# Patient Record
Sex: Female | Born: 1990 | Race: Black or African American | Hispanic: No | Marital: Single | Smoking: Never smoker
Health system: Southern US, Community
[De-identification: ages and names within clinical notes are randomized; demographics above are authoritative.]

## PROBLEM LIST (undated history)

## (undated) DIAGNOSIS — G8250 Quadriplegia, unspecified: Secondary | ICD-10-CM

## (undated) DIAGNOSIS — Z8619 Personal history of other infectious and parasitic diseases: Secondary | ICD-10-CM

## (undated) DIAGNOSIS — S129XXA Fracture of neck, unspecified, initial encounter: Secondary | ICD-10-CM

## (undated) HISTORY — DX: Personal history of other infectious and parasitic diseases: Z86.19

## (undated) HISTORY — DX: Fracture of neck, unspecified, initial encounter: S12.9XXA

## (undated) HISTORY — PX: MOUTH SURGERY: SHX715

## (undated) HISTORY — PX: OTHER SURGICAL HISTORY: SHX169

## (undated) HISTORY — DX: Quadriplegia, unspecified: G82.50

---
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
## 2012-07-03 LAB — TSH: TSH: 2.79 u[IU]/mL (ref 0.41–5.90)

## 2012-07-03 LAB — CBC AND DIFFERENTIAL
HCT: 39 % (ref 36–46)
HEMOGLOBIN: 12.9 g/dL (ref 12.0–16.0)
Platelets: 417 10*3/uL — AB (ref 150–399)
WBC: 7.6 10*3/mL

## 2014-02-11 ENCOUNTER — Emergency Department: Payer: Self-pay | Admitting: Emergency Medicine

## 2014-02-11 DIAGNOSIS — S129XXA Fracture of neck, unspecified, initial encounter: Secondary | ICD-10-CM | POA: Insufficient documentation

## 2014-02-11 LAB — COMPREHENSIVE METABOLIC PANEL
ALT: 25 U/L
AST: 30 U/L (ref 15–37)
Albumin: 3.2 g/dL — ABNORMAL LOW (ref 3.4–5.0)
Alkaline Phosphatase: 48 U/L
Anion Gap: 7 (ref 7–16)
BUN: 9 mg/dL (ref 7–18)
Bilirubin,Total: 0.3 mg/dL (ref 0.2–1.0)
CALCIUM: 8.5 mg/dL (ref 8.5–10.1)
CHLORIDE: 109 mmol/L — AB (ref 98–107)
CO2: 24 mmol/L (ref 21–32)
Creatinine: 0.83 mg/dL (ref 0.60–1.30)
Glucose: 83 mg/dL (ref 65–99)
Osmolality: 277 (ref 275–301)
POTASSIUM: 4 mmol/L (ref 3.5–5.1)
Sodium: 140 mmol/L (ref 136–145)
Total Protein: 6.8 g/dL (ref 6.4–8.2)

## 2014-02-11 LAB — DRUG SCREEN, URINE
Amphetamines, Ur Screen: NEGATIVE (ref ?–1000)
Barbiturates, Ur Screen: NEGATIVE (ref ?–200)
Benzodiazepine, Ur Scrn: NEGATIVE (ref ?–200)
COCAINE METABOLITE, UR ~~LOC~~: NEGATIVE (ref ?–300)
Cannabinoid 50 Ng, Ur ~~LOC~~: NEGATIVE (ref ?–50)
MDMA (ECSTASY) UR SCREEN: NEGATIVE (ref ?–500)
Methadone, Ur Screen: NEGATIVE (ref ?–300)
OPIATE, UR SCREEN: NEGATIVE (ref ?–300)
PHENCYCLIDINE (PCP) UR S: NEGATIVE (ref ?–25)
TRICYCLIC, UR SCREEN: NEGATIVE (ref ?–1000)

## 2014-02-11 LAB — CBC
HCT: 35.9 % (ref 35.0–47.0)
HGB: 11.8 g/dL — ABNORMAL LOW (ref 12.0–16.0)
MCH: 27.6 pg (ref 26.0–34.0)
MCHC: 32.7 g/dL (ref 32.0–36.0)
MCV: 84 fL (ref 80–100)
Platelet: 330 10*3/uL (ref 150–440)
RBC: 4.27 10*6/uL (ref 3.80–5.20)
RDW: 12.8 % (ref 11.5–14.5)
WBC: 9 10*3/uL (ref 3.6–11.0)

## 2014-02-11 LAB — HCG, QUANTITATIVE, PREGNANCY: Beta Hcg, Quant.: <1 m[IU]/mL — ABNORMAL LOW

## 2014-02-11 LAB — TSH: Thyroid Stimulating Horm: 1.55 u[IU]/mL

## 2014-02-11 LAB — ETHANOL: Ethanol: <3 mg/dL

## 2014-02-11 LAB — ACETAMINOPHEN LEVEL

## 2014-02-11 LAB — SALICYLATE LEVEL

## 2014-02-12 HISTORY — PX: OTHER SURGICAL HISTORY: SHX169

## 2014-02-15 DIAGNOSIS — S14105A Unspecified injury at C5 level of cervical spinal cord, initial encounter: Secondary | ICD-10-CM | POA: Insufficient documentation

## 2014-02-15 DIAGNOSIS — S14109A Unspecified injury at unspecified level of cervical spinal cord, initial encounter: Secondary | ICD-10-CM | POA: Insufficient documentation

## 2014-02-26 HISTORY — PX: TRACHEOSTOMY: SUR1362

## 2014-03-10 DIAGNOSIS — R0689 Other abnormalities of breathing: Secondary | ICD-10-CM | POA: Insufficient documentation

## 2014-03-22 HISTORY — PX: OTHER SURGICAL HISTORY: SHX169

## 2014-06-16 ENCOUNTER — Other Ambulatory Visit: Payer: Self-pay | Admitting: Family Medicine

## 2014-06-16 DIAGNOSIS — G8250 Quadriplegia, unspecified: Secondary | ICD-10-CM

## 2014-06-22 ENCOUNTER — Ambulatory Visit: Payer: Self-pay

## 2014-06-23 ENCOUNTER — Ambulatory Visit
Admission: RE | Admit: 2014-06-23 | Discharge: 2014-06-23 | Disposition: A | Discharge status: Home / Self Care | Payer: Medicaid Other | Source: Ambulatory Visit | Attending: Family Medicine | Admitting: Family Medicine

## 2014-06-23 DIAGNOSIS — G8250 Quadriplegia, unspecified: Secondary | ICD-10-CM

## 2014-06-23 DIAGNOSIS — S14109S Unspecified injury at unspecified level of cervical spinal cord, sequela: Secondary | ICD-10-CM

## 2014-07-22 ENCOUNTER — Telehealth: Payer: Self-pay | Admitting: *Deleted

## 2014-07-22 NOTE — Telephone Encounter (Signed)
Robin from home home called office requesting copy of written order be faxed back to her before tomorrow for pt's craterization=q 4 days  and bowl program x2 weekly. Searched all scripts for order but did not see one. Please advise? Fax# [phone]

## 2014-07-23 NOTE — Telephone Encounter (Signed)
Order faxed.

## 2014-07-23 NOTE — Telephone Encounter (Signed)
Order printed and signed.

## 2014-09-03 ENCOUNTER — Telehealth: Payer: Self-pay | Admitting: Family Medicine

## 2014-09-03 NOTE — Telephone Encounter (Signed)
Returned call, info given

## 2014-09-03 NOTE — Telephone Encounter (Signed)
Rosey Bath w/ [employer] would like to speak with a nurse to get pt's height & weight because she needs it to process the order for medical supplies. Thanks TNP

## 2014-10-14 DIAGNOSIS — G904 Autonomic dysreflexia: Secondary | ICD-10-CM | POA: Insufficient documentation

## 2015-03-12 ENCOUNTER — Telehealth: Payer: Self-pay | Admitting: Family Medicine

## 2015-03-12 NOTE — Telephone Encounter (Signed)
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 Shawna Orleans said she can get [name] physical therapy,etc for her through the dismemberment clause in the insurance.     Melanie's call back # is [phone]

## 2015-05-20 DIAGNOSIS — R591 Generalized enlarged lymph nodes: Secondary | ICD-10-CM | POA: Insufficient documentation

## 2015-05-20 DIAGNOSIS — S129XXA Fracture of neck, unspecified, initial encounter: Secondary | ICD-10-CM | POA: Insufficient documentation

## 2015-05-20 DIAGNOSIS — G8250 Quadriplegia, unspecified: Secondary | ICD-10-CM | POA: Insufficient documentation

## 2015-05-20 DIAGNOSIS — R41840 Attention and concentration deficit: Secondary | ICD-10-CM | POA: Insufficient documentation

## 2015-05-27 IMAGING — CT CT HEAD WITHOUT CONTRAST
3 of 5 series · 15 of 47 positions shown, 18 images · non-contrast
Comparison: None.

CLINICAL DATA: Fall wall running. Head injury. Neck pain and
extremity numbness.

EXAM:
CT HEAD WITHOUT CONTRAST
CT CERVICAL SPINE WITHOUT CONTRAST
TECHNIQUE: Multidetector CT imaging of the head and cervical spine was
performed following the standard protocol without intravenous
contrast. Multiplanar CT image reconstructions of the cervical spine
were also generated.

[Series 6: sag bone · sagittal · 0.29mm/px · 3 of 75 slices shown]
[im 25/75  brain]
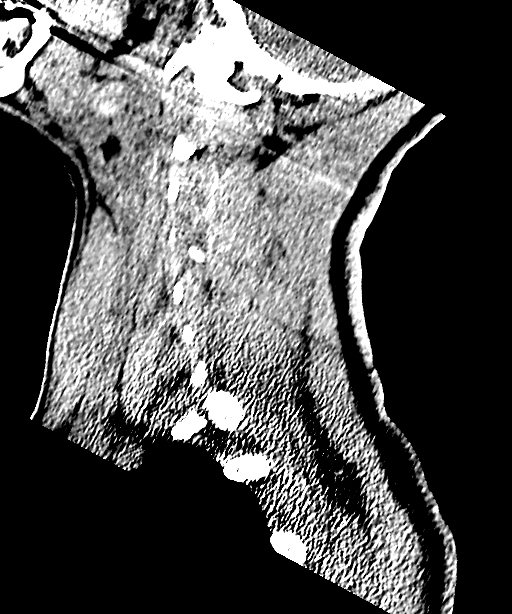
[im 38/75  brain]
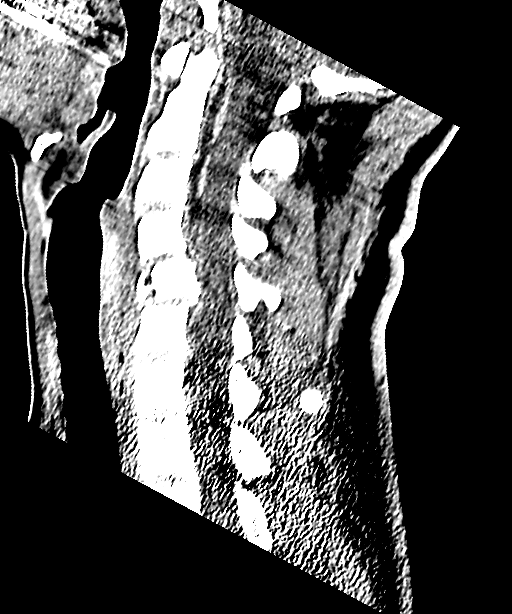
[im 50/75  brain]
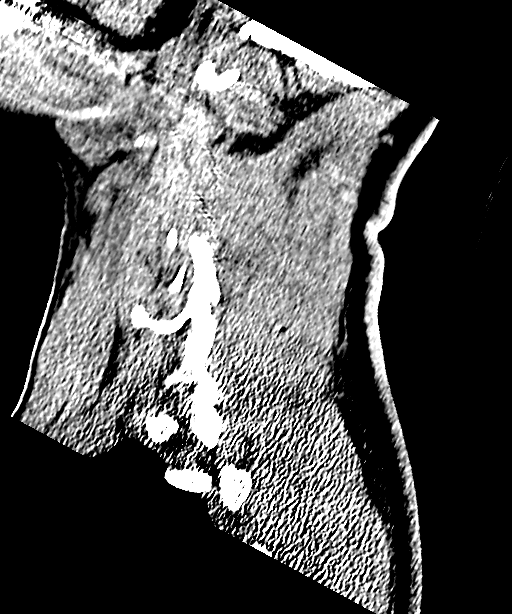

[Series 7: cor bone · coronal · 0.29mm/px · 3 of 75 slices shown]
[im 25/75  brain]
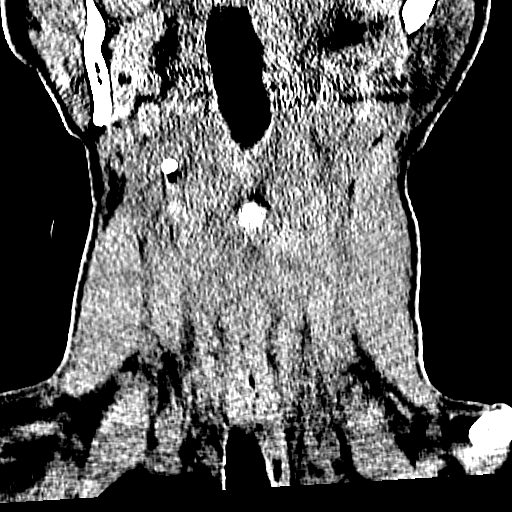
[im 33/75  brain]
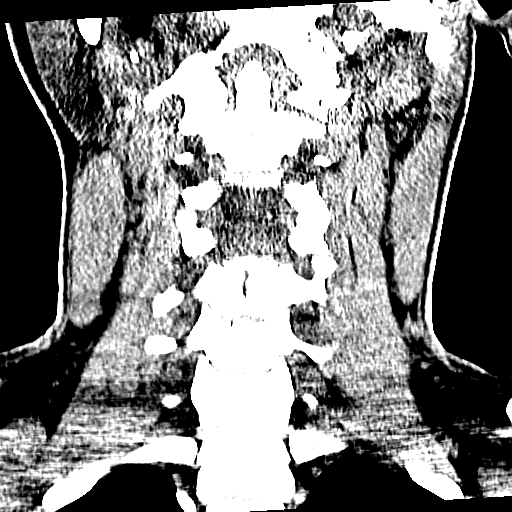
[im 42/75  brain]
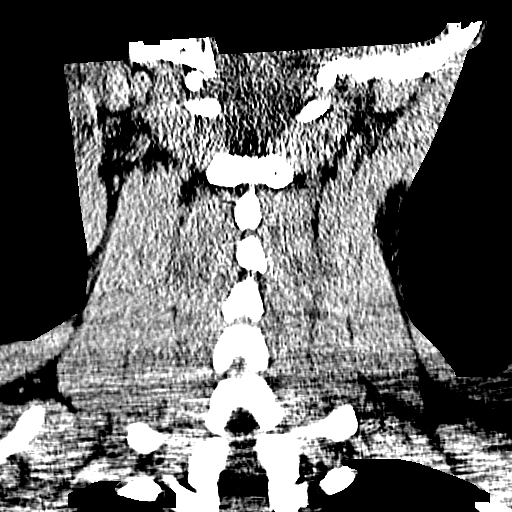

[Series 8: orthogonal axials · axial · 0.29mm/px · z∈[-166,-36]mm · 9 of 85 slices shown, 12 images]
[im 7/85  brain]
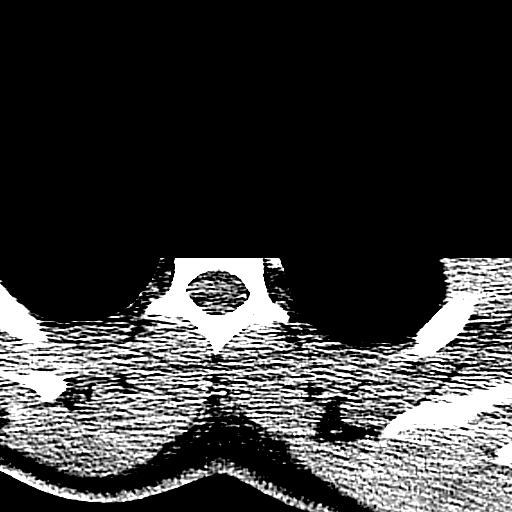
[im 7/85  bone]
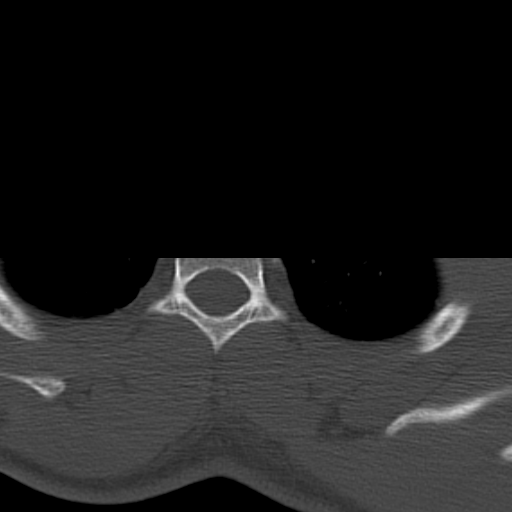
[im 20/85  brain]
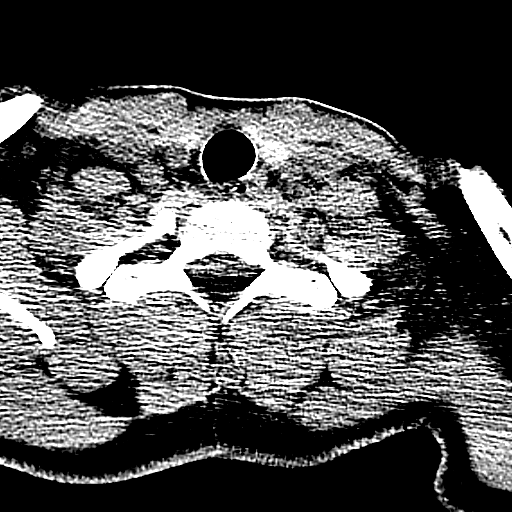
[im 26/85  brain]
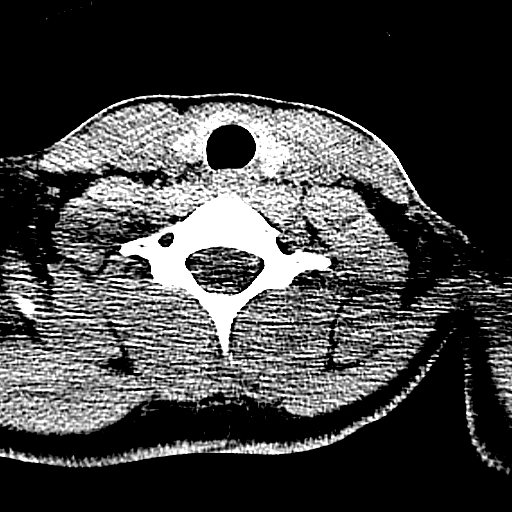
[im 33/85  brain]
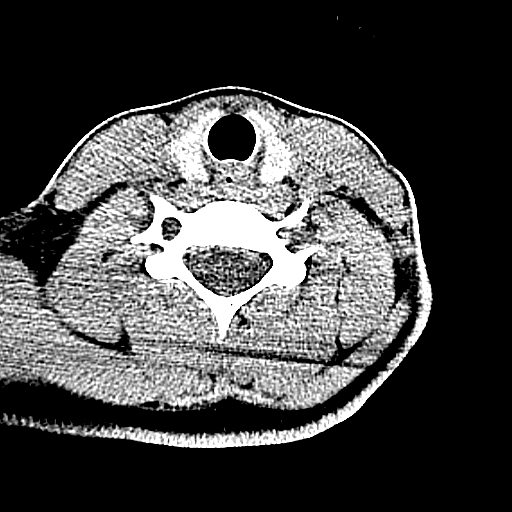
[im 46/85  brain]
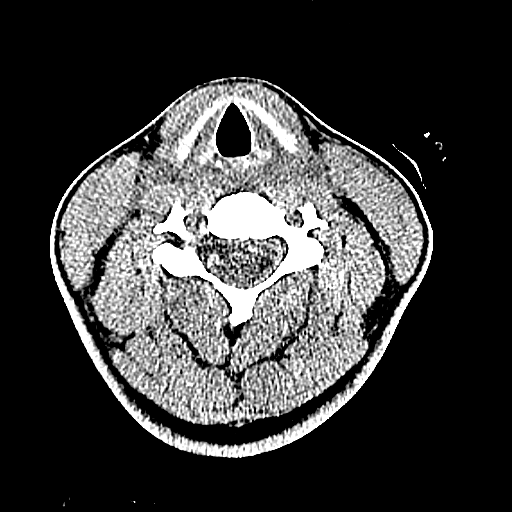
[im 46/85  bone]
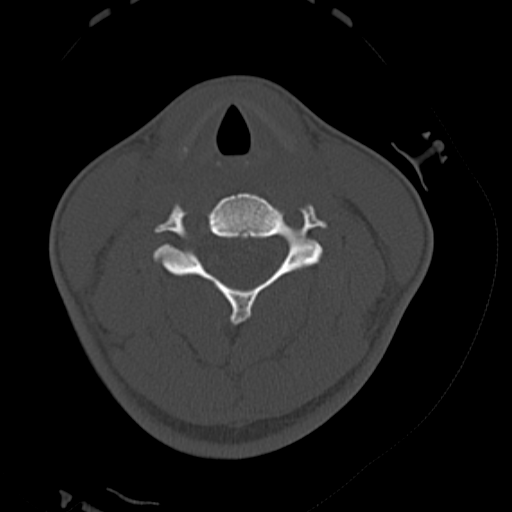
[im 52/85  brain]
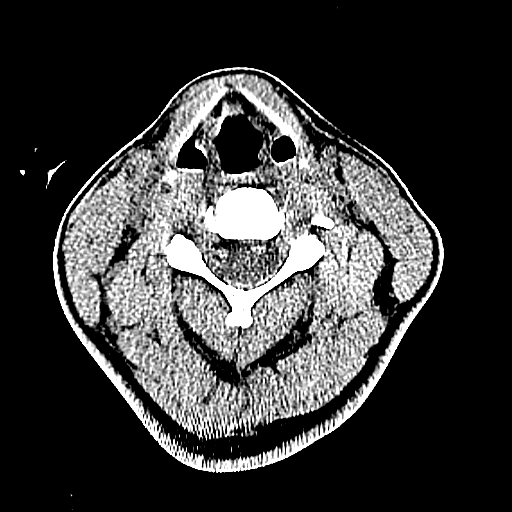
[im 59/85  brain]
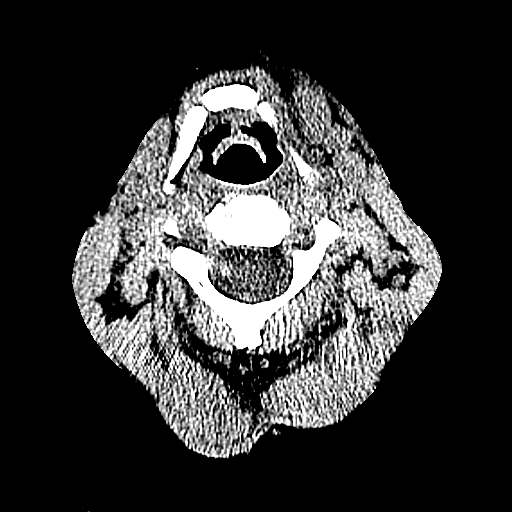
[im 72/85  brain]
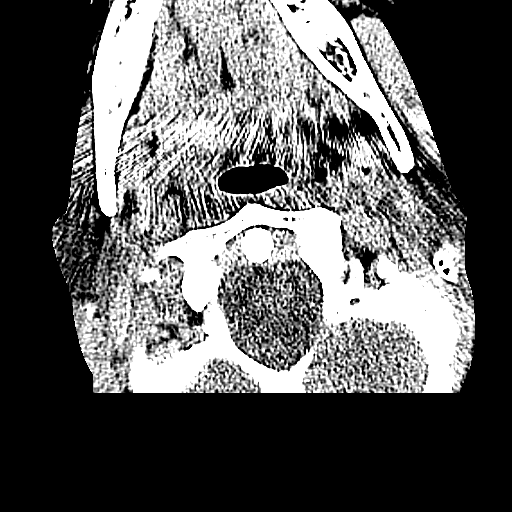
[im 78/85  brain]
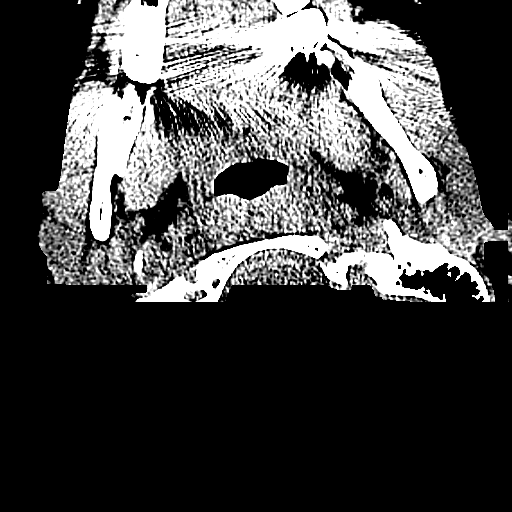
[im 78/85  bone]
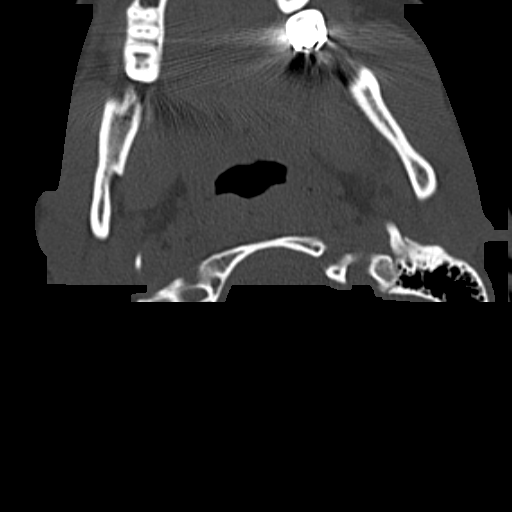

[15 of 47 positions shown; findings below may reference images not displayed]

FINDINGS: CT HEAD FINDINGS

No evidence of intracranial hemorrhage, brain edema, or other signs
of acute infarction. No evidence of intracranial mass lesion or mass
effect.

No abnormal extraaxial fluid collections identified. Ventricles are
normal in size. No skull abnormality identified.

CT CERVICAL SPINE FINDINGS

Comminuted fracture is seen involving the C5 vertebral body with
anterior tear drop fragment. Fractures are also seen involving the
C5 lamina bilaterally. There is mild lateral displacement of the
bilateral articular facets of C5. There is 4 mm retrolisthesis at
C5-6 with associated cervical kyphosis. This is an unstable cervical
spine fracture.

There is also a sagittally oriented fracture through the C6
vertebral body which is nondisplaced. No fractures are seen
involving the posterior elements of C6.
IMPRESSION: Negative noncontrast head CT.

Unstable C5 fracture as described above, with 4 mm retrolisthesis at
C5-6 and associated cervical kyphosis.

C6 vertebral body fracture, without involvement of posterior
elements at this level.

Critical Value/emergent results were called by telephone at the time
of interpretation on 02/11/2014 at [DATE] to Dr. ZHEN SOLARES ,
who verbally acknowledged these results.

## 2015-05-28 ENCOUNTER — Ambulatory Visit: Payer: Self-pay | Admitting: Family Medicine

## 2015-06-07 ENCOUNTER — Ambulatory Visit (INDEPENDENT_AMBULATORY_CARE_PROVIDER_SITE_OTHER): Payer: Medicaid Other | Admitting: Family Medicine

## 2015-06-07 ENCOUNTER — Encounter: Payer: Self-pay | Admitting: Family Medicine

## 2015-06-07 VITALS — BP 82/40 | HR 78 | Temp 97.8°F | Resp 16

## 2015-06-07 DIAGNOSIS — S1201XS Stable burst fracture of first cervical vertebra, sequela: Secondary | ICD-10-CM | POA: Diagnosis not present

## 2015-06-07 DIAGNOSIS — S14109S Unspecified injury at unspecified level of cervical spinal cord, sequela: Secondary | ICD-10-CM | POA: Diagnosis not present

## 2015-06-07 DIAGNOSIS — S129XXS Fracture of neck, unspecified, sequela: Secondary | ICD-10-CM | POA: Diagnosis not present

## 2015-06-07 DIAGNOSIS — G8250 Quadriplegia, unspecified: Secondary | ICD-10-CM

## 2015-06-07 MED ORDER — CARISOPRODOL 350 MG PO TABS: 350.0000 mg | ORAL_TABLET | Freq: Every day | ORAL | Status: DC

## 2015-06-07 NOTE — Progress Notes (Signed)
       Patient: Cassandra GaulGabrielle L Seifert Female    DOB: 03-31-1990   25 y.o.   MRN: [id number] Visit Date: 06/07/2015  Today's Provider: Mila Merryonald Fisher, MD   Chief Complaint  Patient presents with  . Follow-up   Subjective:    HPI Follow up Quadriplegia, post traumatic: Patient is here today for an evaluation to continue Personal Care services for assistance with ADLs. Patient currently has nurse aid who comes into her home to help with bathing and Catheter care. She sustained burst fracture of cervical spine in January 2016  resulting in quadriplegia. [facility]. Since then she has regained movement of both shoulders, some movement in elbows, and right wrist and fingers. She continue PT three times per week and feels it continues to help with her recovery.   Neck pain: Patient has been having neck pain and shoulder pain. She has been using Ibuprofen to help with pain with no relief. Patient states the pain keeps her up at night. The pain is primarily along the muscles between her shoulder and spine.     Allergies no known allergies Previous Medications   BISACODYL (MAGIC BULLETS) 10 MG SUPPOSITORY    Place rectally.   IBUPROFEN (ADVIL,MOTRIN) 800 MG TABLET    Take 1 tablet by mouth as needed.   METAXALONE (SKELAXIN) 800 MG TABLET    Take by mouth as needed.   MIDODRINE (PROAMATINE) 5 MG TABLET    Take 2 tablets by mouth 3 (three) times daily.    Review of Systems  Constitutional: Positive for fatigue. Negative for fever, chills and appetite change.  Respiratory: Negative for chest tightness and shortness of breath.   Cardiovascular: Negative for chest pain and palpitations.  Gastrointestinal: Negative for nausea, vomiting and abdominal pain.  Musculoskeletal: Positive for arthralgias (shoulder pain) and neck pain.  Neurological: Negative for dizziness and weakness.    Social History  Substance Use Topics  . Smoking status: Never Smoker   . Smokeless tobacco: Not on  file  . Alcohol Use: No   Objective:   BP 82/40 mmHg  Pulse 78  Temp(Src) 97.8 F (36.6 C) (Oral)  Resp 16  SpO2 98%  Physical Exam   General Appearance:    Alert, cooperative, no distress. Confined to wheelchair.   Eyes:    PERRL, conjunctiva/corneas clear, EOM's intact       Lungs:     Clear to auscultation bilaterally, respirations unlabored  Heart:    Regular rate and rhythm  MS   Tender along upper trapezius bilaterally. Minimal tenderness of spine.   Neurologic:   Awake, alert, oriented x 3. No s/s of LEs. Slight sensation of UEs. +5 strength right shoulder elbow and wrist. +4 left shoulder, +3 left elbow, +2 left wrist, no movement of left fingers. .           Assessment & Plan:     1. Quadriplegia, post-traumatic (HCC) Slowly regaining some bilateral UE movement, minimal sensation. Continue to benefit for physical therapy and care services.   2. Burst fracture of cervical vertebra, sequela   3. 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

## 2015-07-14 ENCOUNTER — Ambulatory Visit: Payer: Medicaid Other | Admitting: Family Medicine

## 2015-07-24 ENCOUNTER — Emergency Department
Admission: EM | Admit: 2015-07-24 | Discharge: 2015-07-24 | Disposition: A | Discharge status: Home / Self Care | Payer: Medicaid Other | Attending: Emergency Medicine | Admitting: Emergency Medicine

## 2015-07-24 DIAGNOSIS — Z79899 Other long term (current) drug therapy: Secondary | ICD-10-CM | POA: Insufficient documentation

## 2015-07-24 DIAGNOSIS — M62838 Other muscle spasm: Secondary | ICD-10-CM | POA: Diagnosis present

## 2015-07-24 DIAGNOSIS — N390 Urinary tract infection, site not specified: Secondary | ICD-10-CM | POA: Insufficient documentation

## 2015-07-24 DIAGNOSIS — R109 Unspecified abdominal pain: Secondary | ICD-10-CM

## 2015-07-24 LAB — URINALYSIS COMPLETE WITH MICROSCOPIC (ARMC ONLY)
BILIRUBIN URINE: NEGATIVE
GLUCOSE, UA: NEGATIVE mg/dL
HGB URINE DIPSTICK: NEGATIVE
KETONES UR: NEGATIVE mg/dL
NITRITE: NEGATIVE
PH: 8 (ref 5.0–8.0)
Protein, ur: 100 mg/dL — AB
Specific Gravity, Urine: 1.023 (ref 1.005–1.030)

## 2015-07-24 LAB — COMPREHENSIVE METABOLIC PANEL
ALK PHOS: 55 U/L (ref 38–126)
ALT: 16 U/L (ref 14–54)
ANION GAP: 8 (ref 5–15)
AST: 21 U/L (ref 15–41)
Albumin: 4.3 g/dL (ref 3.5–5.0)
BILIRUBIN TOTAL: 0.3 mg/dL (ref 0.3–1.2)
BUN: 10 mg/dL (ref 6–20)
CALCIUM: 9.6 mg/dL (ref 8.9–10.3)
CO2: 21 mmol/L — AB (ref 22–32)
Chloride: 108 mmol/L (ref 101–111)
Creatinine, Ser: 0.55 mg/dL (ref 0.44–1.00)
Glucose, Bld: 105 mg/dL — ABNORMAL HIGH (ref 65–99)
Potassium: 3.9 mmol/L (ref 3.5–5.1)
SODIUM: 137 mmol/L (ref 135–145)
TOTAL PROTEIN: 7.4 g/dL (ref 6.5–8.1)

## 2015-07-24 LAB — TROPONIN I: Troponin I: <0.03 ng/mL (ref <0.031)

## 2015-07-24 LAB — CBC
HCT: 36.7 % (ref 35.0–47.0)
HEMOGLOBIN: 12.4 g/dL (ref 12.0–16.0)
MCH: 28.3 pg (ref 26.0–34.0)
MCHC: 33.7 g/dL (ref 32.0–36.0)
MCV: 84 fL (ref 80.0–100.0)
PLATELETS: 299 10*3/uL (ref 150–440)
RBC: 4.37 MIL/uL (ref 3.80–5.20)
RDW: 12.8 % (ref 11.5–14.5)
WBC: 7.5 10*3/uL (ref 3.6–11.0)

## 2015-07-24 LAB — LIPASE, BLOOD: LIPASE: 26 U/L (ref 11–51)

## 2015-07-24 MED ORDER — FENTANYL CITRATE (PF) 100 MCG/2ML IJ SOLN
INTRAMUSCULAR | Status: AC
  Administered 2015-07-24: 50 ug
  Filled 2015-07-24: qty 2

## 2015-07-24 MED ORDER — DEXTROSE 5 % IV SOLN
1.0000 g | Freq: Once | INTRAVENOUS | Status: AC
  Administered 2015-07-24: 1 g via INTRAVENOUS
  Filled 2015-07-24: qty 10

## 2015-07-24 MED ORDER — CEPHALEXIN 500 MG PO CAPS: 500.0000 mg | ORAL_CAPSULE | Freq: Three times a day (TID) | ORAL | Status: DC

## 2015-07-24 MED ORDER — SODIUM CHLORIDE 0.9 % IV BOLUS (SEPSIS)
1000.0000 mL | Freq: Once | INTRAVENOUS | Status: AC
  Administered 2015-07-24: 1000 mL via INTRAVENOUS

## 2015-07-24 NOTE — ED Notes (Addendum)
Pts family here for pick up, went to room to DC pt while pts mother spoke w/ MD. Pt sts that MD Paduchowski did not do bedside US. Informed pt that MD sts that he did and that exam looked good.  Pt sts that she would remember having US done, asked this RN to look at belly for evidence of US and restated that MD had not done it.  Pts mother came into room and told pt that she had spoken w/ MD and that exam had been done.  Pt changed, dressed and put in wheelchair for DC.  Pt requested number for person who "could make change" and "do some education".  Gave pt Debbie Hunt's number.

## 2015-07-24 NOTE — ED Notes (Addendum)
Pt reports to ED bib EMS w/ c/o abd pain/spasms that started yesterday. Pt sts that she has been unable to concentrate, denies LOC or dizziness.  Pt sts "like today I was on facebook and felt like I had to close my eyes". Pt sts "I know something is wrong, I just don't know what".  Pt resp even, able to speak w/o difficulty.  Nausea w/o vomiting. NAD.

## 2015-07-24 NOTE — ED Notes (Addendum)
Pts family called, ETA 15-20 minutes. Pt sts that MD Paduchowski was to do bladder US to check for stones, told pt I would check with MD.  MD Paduchowski reminded of bedside US, informed him family would return in 15-20 minutes.  He stated he would do scan.

## 2015-07-24 NOTE — ED Notes (Signed)
Informed pt that she would be receiving fentaynl and fluids through IV.  Pt supine in bed, NAD.

## 2015-07-24 NOTE — ED Notes (Addendum)
Pt concerned about BP rising, stating that she is afraid of having stroke and that her BP is normally very low.  Pt sts "if I have a stroke, I'm going to sue all of you".  Explained to pt that we had just completed in and out cath and rolled pt, that BP might be effected because of these tasks. Told pt I would recheck BP when finished w/ tasks.  Pt sts that this RN is not advocating for pt, that she must advocate for herself.  Encouraged pt to advocate for self.  Told pt that I would speak to MD, but that reiterated that IV access needed to be established and blood work obtained.  Explained to pt that fast acting BP medications would need to be given through IV. Pt sts "even though I can't feel pain, my body is reacting to pain" and requested pain medication.  Informed MD Paduchowski of pts request for BP and pain medication. Retook BP after labs and urine collected, BP came down.

## 2015-07-24 NOTE — ED Provider Notes (Addendum)
[facility] Emergency Department Provider Note  Time seen: 4:24 PM  I have reviewed the triage vital signs and the nursing notes.   HISTORY  Chief Complaint Abdominal Pain    HPI Cassandra Lowe is a 25 y.o. female with a past medical history of quadriplegia after a cervical spine fracture January 2016, who presents to the emergency department by EMS with abdominal spasming. According to the patient she has some movement in the right arm, minimal movement in the left arm, and no movement in either legs at baseline. Patient states she is able to feel discomfort in her abdomen but cannot pinpoint exactly where it is hurting. Patient states since last night she has been having intermittent spasming of the abdomen. She states when her abdomen spasms it typically means it is having pain although she states she cannot feel the pain. Is not sure if she is having pain when she urinates. Denies nausea or vomiting. Patient states today she has been feeling somewhat confused at times, denies any confusion currently. The patient is alert and oriented, able to give a very clear patient history.     Past Medical History  Diagnosis Date  . History of chicken pox   . Quadriplegia (HCC)   . Burst fracture of cervical vertebra [facility](HCC)     Patient Active Problem List   Diagnosis Date Noted  . 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 Lack of concentration 05/20/2015  . Lymphadenopathy 05/20/2015  . Quadriplegia, post-traumatic (HCC) 05/20/2015  . 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 Burst fracture of cervical vertebra (HCC) 02/11/2014    Past Surgical History  Procedure Laterality Date  . Mouth surgery      one tooth removed  . C4-c6 cervical fusion with decompression or c5 burst fracture  02/12/2014  . Tracheostomy  02/26/2014    pallative tracheostomy for respiratory failure secondary to C-spine fracture and paraplegia  .  Reversal of tracheostomy  03/22/2014    Current Outpatient Rx  Name  Route  Sig  Dispense  Refill  . bisacodyl (MAGIC BULLETS) 10 MG suppository   Rectal   Place rectally.         . carisoprodol (SOMA) 350 MG tablet   Oral   Take 1 tablet (350 mg total) by mouth at bedtime.   30 tablet   1   . ibuprofen (ADVIL,MOTRIN) 800 MG tablet   Oral   Take 1 tablet by mouth as needed.         . metaxalone (SKELAXIN) 800 MG tablet   Oral   Take by mouth as needed.         . midodrine (PROAMATINE) 5 MG tablet   Oral   Take 2 tablets by mouth 3 (three) times daily.           Allergies Morphine and related; Oxycodone; and Tramadol  Family History  Problem Relation Age of Onset  . Ovarian cysts Mother   . Dysmenorrhea Mother   . Hypertension Other   . Diabetes Other     Social History Social History  Substance Use Topics  . Smoking status: Never Smoker   . Smokeless tobacco: None  . Alcohol Use: No    Review of Systems Constitutional: Negative for fever. Cardiovascular: Negative for chest pain. Respiratory: Negative for shortness of breath. Gastrointestinal: Positive for abdominal spasming, possible pain. Denies vomiting, diarrhea. Genitourinary: Negative for dysuria , But states she may not know if she  was having dysuria Neurological: Negative for headache 10-point ROS otherwise negative.  ____________________________________________   PHYSICAL EXAM:  VITAL SIGNS: ED Triage Vitals  Enc Vitals Group     BP --      Pulse Rate 07/24/15 1601 77     Resp 07/24/15 1601 18     Temp 07/24/15 1601 98.5 F (36.9 C)     Temp Source 07/24/15 1601 Oral     SpO2 07/24/15 1601 99 %     Weight 07/24/15 1601 100 lb (45.36 kg)     Height --      Head Cir --      Peak Flow --      Pain Score 07/24/15 1602 1     Pain Loc --      Pain Edu? --      Excl. in GC? --     Constitutional: Alert and oriented. Well appearing and in no distress. Eyes: Normal  exam ENT   Head: Normocephalic and atraumatic   Mouth/Throat: Mucous membranes are moist. Cardiovascular: Normal rate, regular rhythm. No murmur Respiratory: Normal respiratory effort without tachypnea nor retractions. Breath sounds are clear  Gastrointestinal:  Soft, patient states her abdomen spasms during exam and is asking me not to press on the abdomen. Patient states she can not localize the pain anyways. No distention. No apparent rebound or guarding. Musculoskeletal:  Nontender, no abnormality identified. Neurologic:  Normal speech and language.  No new neurologic deficits per patient. Skin:  Skin is warm, dry and intact.  Psychiatric: Mood and affect are normal.  ____________________________________________   INITIAL IMPRESSION / ASSESSMENT AND PLAN / ED COURSE  Pertinent labs & imaging results that were available during my care of the patient were reviewed by me and considered in my medical decision making (see chart for details).  The patient presents the emergency department with intermittent abdominal spasming since yesterday, states she believes she could be having abdominal pain but states she cannot feel the pain since her spinal cord injury. Patient's vitals are largely within normal limits. Patient appears well during exam, she does have discomfort during abdominal palpation but cannot localize where the discomfort is. Patient states normal bowel movements including today. Denies vomiting. We will check labs, IV hydrate, perform an in and out catheterization to check a urinalysis, and closely monitor while awaiting lab results.  Labs are consistent with a significant urinary tract infection. Patient does do in and out catheterizations at home but has no chronic indwelling Foley catheter. I suspect urinary tract infection is likely the cause of the patient's abdominal spasming. Patient's blood work is largely within normal limits. We'll dose IV Rocephin in the emergency  department, discharged on Keflex 3 times a day 10 days. I have sent a urine culture.  I will attempt to message Dr. Sherrie Mustache through Epic so he can follow-up on the urine culture.  Bedside ultrasound shows no bladder stones. Which was a concern of the patient's mother as the patient has had them in the past.  ____________________________________________   FINAL CLINICAL IMPRESSION(S) / ED DIAGNOSES  abdominal pain Urinary tract infection  Minna Antis, MD 07/24/15 1744  Minna Antis, MD 07/24/15 [phone]

## 2015-07-24 NOTE — ED Notes (Signed)
Pts IV antibiotics complete and disconected, informed pt that she was up for discharge. Told pt that we were waiting for DC papers.  Pt stated that her family would be returning to pick her up.

## 2015-07-24 NOTE — Discharge Instructions (Signed)

## 2015-07-27 DIAGNOSIS — F3110 Bipolar disorder, current episode manic without psychotic features, unspecified: Secondary | ICD-10-CM | POA: Insufficient documentation

## 2015-07-27 LAB — URINE CULTURE: Culture: >=100000 — AB

## 2015-09-07 ENCOUNTER — Other Ambulatory Visit: Payer: Self-pay | Admitting: Family Medicine

## 2016-02-29 ENCOUNTER — Telehealth: Payer: Self-pay

## 2016-02-29 MED ORDER — PROMETHAZINE HCL 25 MG PO TABS: 25.0000 mg | ORAL_TABLET | Freq: Three times a day (TID) | ORAL | 0 refills | Status: DC | PRN

## 2016-02-29 NOTE — Telephone Encounter (Signed)
Please review. Thanks!  

## 2016-02-29 NOTE — Telephone Encounter (Signed)
Mother called and reports pt has a stomach bug. Is requesting medication for nausea. [facility] [address]. CB for Regenia SkeeterMelanie Litten is [phone]. Allene DillonEmily Drozdowski, CMA

## 2016-02-29 NOTE — Telephone Encounter (Signed)
Have sent prescription [facility]azine to [facility].

## 2016-03-01 NOTE — Telephone Encounter (Signed)
Left message to call back  

## 2016-03-02 NOTE — Telephone Encounter (Signed)
Unable to contact pt's mom to notify her that rx was sent to pharmacy. Will close out note.

## 2016-03-13 ENCOUNTER — Encounter: Payer: Self-pay | Admitting: Family Medicine

## 2016-03-13 ENCOUNTER — Ambulatory Visit (INDEPENDENT_AMBULATORY_CARE_PROVIDER_SITE_OTHER): Payer: Medicare Other | Admitting: Family Medicine

## 2016-03-13 VITALS — BP 110/58 | HR 74 | Temp 98.0°F | Resp 16

## 2016-03-13 DIAGNOSIS — N760 Acute vaginitis: Secondary | ICD-10-CM | POA: Diagnosis not present

## 2016-03-13 MED ORDER — METRONIDAZOLE 500 MG PO TABS: 500.0000 mg | ORAL_TABLET | Freq: Two times a day (BID) | ORAL | 0 refills | Status: AC

## 2016-03-13 MED ORDER — AZITHROMYCIN 2 G PO SUSR: 2.0000 g | Freq: Once | ORAL | 0 refills | Status: AC

## 2016-03-13 NOTE — Progress Notes (Signed)
Patient: Cassandra Lowe Female    DOB: 1991-01-31   26 y.o.   MRN: [id number] Visit Date: 03/13/2016  Today's Provider: Lelon Huh, MD   Chief Complaint  Patient presents with  . Exposure to STD   Subjective:    Patient stated she notified a vaginal discharge and odor last night. Patient stated that her partner's ex girlfriend tested positive for chlamydia and she is not sure if this could have been passed to her. Patient also has mild lower abdominal pain.     Vaginal Discharge  The patient's primary symptoms include a genital odor and vaginal discharge. This is a new problem. The problem occurs constantly. The patient is experiencing no pain. Associated symptoms include abdominal pain, frequency, headaches and rash. Pertinent negatives include no anorexia, back pain, chills, constipation, diarrhea, discolored urine, dysuria, fever, flank pain, hematuria, joint pain, joint swelling, nausea, painful intercourse, sore throat, urgency or vomiting. The vaginal discharge was yellow. She has not been passing clots. She has not been passing tissue. The symptoms are aggravated by activity and intercourse. She is sexually active. It is possible that her partner has an STD.      Allergies  Allergen Reactions  . Latex     Other reaction(s): Other (See Comments) "Skin burns" Skin breakdown  . Morphine And Related Nausea And Vomiting  . Oxycodone Itching  . Tape     Other reaction(s): Other (See Comments) "Skin burns" Skin breakdown  . Tramadol Itching    Patient can take if she takes a benadryl with it.     Current Outpatient Prescriptions:  .  baclofen (LIORESAL) 10 MG tablet, Take 10 mg by mouth 3 (three) times daily as needed. For muscle relaxer., Disp: , Rfl:  .  bisacodyl (MAGIC BULLETS) 10 MG suppository, Place 10 mg rectally daily as needed for mild constipation or moderate constipation. , Disp: , Rfl:  .  carisoprodol (SOMA) 350 MG tablet, Take 1 tablet (350 mg  total) by mouth at bedtime., Disp: 30 tablet, Rfl: 1 .  metaxalone (SKELAXIN) 800 MG tablet, Take 800 mg by mouth 3 (three) times daily as needed for muscle spasms. , Disp: , Rfl:  .  midodrine (PROAMATINE) 5 MG tablet, Take 2 tablets by mouth 3 (three) times daily as needed. , Disp: , Rfl:  .  promethazine (PHENERGAN) 25 MG tablet, Take 1 tablet (25 mg total) by mouth every 8 (eight) hours as needed for nausea or vomiting., Disp: 10 tablet, Rfl: 0  Review of Systems  Constitutional: Negative for appetite change, chills, fatigue and fever.  HENT: Negative for sore throat.   Respiratory: Negative for chest tightness and shortness of breath.   Cardiovascular: Negative for chest pain and palpitations.  Gastrointestinal: Positive for abdominal distention and abdominal pain. Negative for anorexia, constipation, diarrhea, nausea and vomiting.  Genitourinary: Positive for frequency and vaginal discharge. Negative for dysuria, flank pain, hematuria and urgency.  Musculoskeletal: Negative for back pain and joint pain.  Skin: Positive for rash.  Neurological: Positive for headaches. Negative for dizziness and weakness.    Social History  Substance Use Topics  . Smoking status: Never Smoker  . Smokeless tobacco: Never Used  . Alcohol use No   Objective:   BP (!) 110/58 (BP Location: Left Arm, Patient Position: Sitting, Cuff Size: Normal)   Pulse 74   Temp 98 F (36.7 C) (Oral)   Resp 16   SpO2 98%   Physical Exam  General  appearance: alert, well developed, well nourished, cooperative and in no distress Head: Normocephalic, without obvious abnormality, atraumatic Respiratory: Respirations even and unlabored, normal respiratory rate Extremities: No gross deformities Skin: Skin color, texture, turgor normal. No rashes seen  Psych: Appropriate mood and affect. Neurologic: Mental status: Alert, oriented to person, place, and time, thought content appropriate.     Assessment & Plan:     1.  Acute vaginitis Patient unable to provide urine sample or get in position for gyn exam due to being wheelchair confined due to quadriplegia. Will treat empirically with metronidazole and azithromycin. She is to call to get urine sample kit if symptoms do not resolve after finishing antibiotic.        Lelon Huh, MD  [facility]

## 2016-03-14 ENCOUNTER — Ambulatory Visit: Payer: Medicaid Other | Admitting: Family Medicine

## 2016-03-14 ENCOUNTER — Other Ambulatory Visit: Payer: Self-pay | Admitting: Family Medicine

## 2016-03-14 ENCOUNTER — Telehealth: Payer: Self-pay | Admitting: Family Medicine

## 2016-03-14 ENCOUNTER — Other Ambulatory Visit: Payer: Self-pay | Admitting: *Deleted

## 2016-03-14 MED ORDER — AZITHROMYCIN 250 MG PO TABS: 1000.0000 mg | ORAL_TABLET | Freq: Once | ORAL | 0 refills | Status: DC

## 2016-03-14 MED ORDER — AZITHROMYCIN 250 MG PO TABS: 1000.0000 mg | ORAL_TABLET | Freq: Once | ORAL | 0 refills | Status: AC

## 2016-03-14 NOTE — Telephone Encounter (Deleted)
[facility] and see if they have the one gram packet. If so can take 2 backs. Otherwise can get 500mg  tablet and take four of them.

## 2016-03-14 NOTE — Telephone Encounter (Signed)
Patient called back and requested rx be [facility] [address] [place]. Rx was sent to pharmacy.

## 2016-03-14 NOTE — Telephone Encounter (Signed)
Pt stated that she tried to get the Rx for azithromycin (ZMAX) 2 g suspension filed yesterday but was told by multiple pharmacies that suspension has been discontinued. Pt is requesting to pick up another hard script for the medication but written in a dosage that hasn't been discontinued or a different medication. Please advise. Thanks TNP

## 2016-03-14 NOTE — Telephone Encounter (Signed)
Please review. Emily Drozdowski, CMA  

## 2016-04-10 DIAGNOSIS — S14105S Unspecified injury at C5 level of cervical spinal cord, sequela: Secondary | ICD-10-CM | POA: Diagnosis not present

## 2016-04-28 ENCOUNTER — Telehealth: Payer: Self-pay | Admitting: Family Medicine

## 2016-04-28 NOTE — Telephone Encounter (Signed)
Pt needs refill on her blood pressure med  midodrine (PROAMATINE) 5 MG tablet  She uses [facility] [address] [address]  Thanks, Theme park managerTeri

## 2016-04-28 NOTE — Telephone Encounter (Signed)
Please review-aa 

## 2016-04-29 MED ORDER — MIDODRINE HCL 5 MG PO TABS: 10.0000 mg | ORAL_TABLET | Freq: Three times a day (TID) | ORAL | 0 refills | Status: DC | PRN

## 2016-04-29 MED ORDER — MIDODRINE HCL 5 MG PO TABS
10.0000 mg | ORAL_TABLET | Freq: Three times a day (TID) | ORAL | 0 refills | Status: DC | PRN
Start: 2016-04-29 — End: 2016-04-29

## 2016-04-29 NOTE — Telephone Encounter (Signed)
Rx sent x 1 month

## 2016-04-29 NOTE — Telephone Encounter (Signed)
Pt needs rx today.  She is disabled and is completely out.  [facility] [address]

## 2016-05-03 DIAGNOSIS — Z113 Encounter for screening for infections with a predominantly sexual mode of transmission: Secondary | ICD-10-CM | POA: Diagnosis not present

## 2016-05-03 DIAGNOSIS — M792 Neuralgia and neuritis, unspecified: Secondary | ICD-10-CM | POA: Diagnosis not present

## 2016-05-03 DIAGNOSIS — Z309 Encounter for contraceptive management, unspecified: Secondary | ICD-10-CM | POA: Diagnosis not present

## 2016-05-03 DIAGNOSIS — M62838 Other muscle spasm: Secondary | ICD-10-CM | POA: Diagnosis not present

## 2016-05-03 DIAGNOSIS — N319 Neuromuscular dysfunction of bladder, unspecified: Secondary | ICD-10-CM | POA: Diagnosis not present

## 2016-05-03 DIAGNOSIS — S14105S Unspecified injury at C5 level of cervical spinal cord, sequela: Secondary | ICD-10-CM | POA: Diagnosis not present

## 2016-05-03 DIAGNOSIS — Z681 Body mass index (BMI) 19 or less, adult: Secondary | ICD-10-CM | POA: Diagnosis not present

## 2016-05-03 DIAGNOSIS — L089 Local infection of the skin and subcutaneous tissue, unspecified: Secondary | ICD-10-CM | POA: Diagnosis not present

## 2016-05-29 DIAGNOSIS — L089 Local infection of the skin and subcutaneous tissue, unspecified: Secondary | ICD-10-CM | POA: Diagnosis not present

## 2016-05-29 DIAGNOSIS — L600 Ingrowing nail: Secondary | ICD-10-CM | POA: Diagnosis not present

## 2016-05-30 DIAGNOSIS — N3941 Urge incontinence: Secondary | ICD-10-CM | POA: Diagnosis not present

## 2016-05-30 DIAGNOSIS — N319 Neuromuscular dysfunction of bladder, unspecified: Secondary | ICD-10-CM | POA: Diagnosis not present

## 2016-06-07 ENCOUNTER — Other Ambulatory Visit: Payer: Self-pay | Admitting: Family Medicine

## 2016-06-07 ENCOUNTER — Other Ambulatory Visit: Payer: Self-pay | Admitting: Physician Assistant

## 2016-06-07 MED ORDER — MIDODRINE HCL 5 MG PO TABS: 10.0000 mg | ORAL_TABLET | Freq: Three times a day (TID) | ORAL | 4 refills | Status: DC | PRN

## 2016-06-07 NOTE — Telephone Encounter (Signed)
Pt needs refill   midodrine (PROAMATINE) 5 MG tablet     [facility] [address]  Thanks Barth Kirks

## 2016-06-08 DIAGNOSIS — Z308 Encounter for other contraceptive management: Secondary | ICD-10-CM | POA: Diagnosis not present

## 2016-06-08 DIAGNOSIS — N319 Neuromuscular dysfunction of bladder, unspecified: Secondary | ICD-10-CM | POA: Diagnosis not present

## 2016-06-08 DIAGNOSIS — Z681 Body mass index (BMI) 19 or less, adult: Secondary | ICD-10-CM | POA: Diagnosis not present

## 2016-06-08 DIAGNOSIS — Z01419 Encounter for gynecological examination (general) (routine) without abnormal findings: Secondary | ICD-10-CM | POA: Diagnosis not present

## 2016-06-08 DIAGNOSIS — Z91048 Other nonmedicinal substance allergy status: Secondary | ICD-10-CM | POA: Diagnosis not present

## 2016-06-08 DIAGNOSIS — Z885 Allergy status to narcotic agent status: Secondary | ICD-10-CM | POA: Diagnosis not present

## 2016-06-08 DIAGNOSIS — Z113 Encounter for screening for infections with a predominantly sexual mode of transmission: Secondary | ICD-10-CM | POA: Diagnosis not present

## 2016-06-08 DIAGNOSIS — Z91040 Latex allergy status: Secondary | ICD-10-CM | POA: Diagnosis not present

## 2016-06-08 DIAGNOSIS — S14105S Unspecified injury at C5 level of cervical spinal cord, sequela: Secondary | ICD-10-CM | POA: Diagnosis not present

## 2016-06-08 DIAGNOSIS — Z30011 Encounter for initial prescription of contraceptive pills: Secondary | ICD-10-CM | POA: Diagnosis not present

## 2016-06-08 DIAGNOSIS — Z91411 Personal history of adult psychological abuse: Secondary | ICD-10-CM | POA: Diagnosis not present

## 2016-06-08 DIAGNOSIS — Z309 Encounter for contraceptive management, unspecified: Secondary | ICD-10-CM | POA: Diagnosis not present

## 2016-06-08 DIAGNOSIS — G8220 Paraplegia, unspecified: Secondary | ICD-10-CM | POA: Diagnosis not present

## 2016-06-08 DIAGNOSIS — Z3202 Encounter for pregnancy test, result negative: Secondary | ICD-10-CM | POA: Diagnosis not present

## 2016-06-08 LAB — HPV, HIGH-RISK: HPV DNA High Risk: NORMAL

## 2016-06-08 LAB — HM PAP SMEAR: HM Pap smear: NORMAL

## 2016-06-22 ENCOUNTER — Other Ambulatory Visit: Payer: Self-pay | Admitting: Family Medicine

## 2016-06-22 NOTE — Telephone Encounter (Signed)
Pt says there was a order request for special catheters with bags.sent to us.  She said she is getting the wrong catheter.  Can we check her chart and make sure that the request form is mailed or faxed back to them,  She also needs a refill on her midodrine (PROAMATINE) 5 MG tablet.  She would like more than 30 days.  Pt called back is [phone]  Thanks Barth Kirkseri

## 2016-06-23 ENCOUNTER — Other Ambulatory Visit: Payer: Self-pay

## 2016-06-23 MED ORDER — MIDODRINE HCL 5 MG PO TABS: ORAL_TABLET | ORAL | 3 refills | Status: DC

## 2016-06-23 NOTE — Telephone Encounter (Signed)
I have refaxed forms that were faxed in march and put a note to the company to check with patient on the correct thing they should be ordering for her. Please review the refill request and let me know if there are any other forms you have seen for this recently -aa

## 2016-07-06 ENCOUNTER — Other Ambulatory Visit: Payer: Self-pay | Admitting: Physician Assistant

## 2016-07-06 ENCOUNTER — Telehealth: Payer: Self-pay | Admitting: Family Medicine

## 2016-07-06 DIAGNOSIS — N390 Urinary tract infection, site not specified: Secondary | ICD-10-CM | POA: Diagnosis not present

## 2016-07-06 NOTE — Telephone Encounter (Signed)
Pt is a quadriplegic and has a urologist in [place]. She spoke with her urologist and told her to call her nearest doctor and get the order for a urine culture, then her caregiver will bring the urine (she said she has the sterile cups) up here for us to send off for culture. She says that this is emergent and if they do a stat culture would be best. She then needs the results sent to her urologist in [place] for the treatment etc. She says she has had to do this here before. She would like a call back today.  Please advise.

## 2016-07-06 NOTE — Telephone Encounter (Signed)
Pt informed, Will send off urine when she gets the urine to us.

## 2016-07-06 NOTE — Telephone Encounter (Signed)
Culture ordered. I don't think cultures can be made stat because you have to wait the time for bacteria to grow on the dish to see how it will respond. We can forward results to Dr. Raoul PitchBorawski once received.

## 2016-07-06 NOTE — Telephone Encounter (Signed)
Pt wants us to do a [facility] for [facility]  She thinks she has a UTI.  She says she dont usually come in.  She has a cath.  She says  It is an emergent situation.  Her call back is [phone]   Thanks Barth Kirksteri

## 2016-07-12 ENCOUNTER — Telehealth: Payer: Self-pay

## 2016-07-12 DIAGNOSIS — Z7982 Long term (current) use of aspirin: Secondary | ICD-10-CM | POA: Diagnosis not present

## 2016-07-12 DIAGNOSIS — Z681 Body mass index (BMI) 19 or less, adult: Secondary | ICD-10-CM | POA: Diagnosis not present

## 2016-07-12 DIAGNOSIS — Z79899 Other long term (current) drug therapy: Secondary | ICD-10-CM | POA: Diagnosis not present

## 2016-07-12 DIAGNOSIS — N3941 Urge incontinence: Secondary | ICD-10-CM | POA: Diagnosis not present

## 2016-07-12 DIAGNOSIS — N319 Neuromuscular dysfunction of bladder, unspecified: Secondary | ICD-10-CM | POA: Diagnosis not present

## 2016-07-12 LAB — URINE CULTURE

## 2016-07-12 LAB — PLEASE NOTE

## 2016-07-12 NOTE — Telephone Encounter (Signed)
F[facility]ed [facility] to [facility] Dr. Raoul PitchBorawski  Thanks,  -Masyn Fullam

## 2016-07-12 NOTE — Telephone Encounter (Signed)
-----   Message from Margaretann LovelessJennifer M Burnette, PA-C sent at 07/12/2016  8:25 AM EDT ----- See sensitivities and send to her urologist please. Thanks.

## 2016-07-12 NOTE — Telephone Encounter (Signed)
Faxed to Dr[facility] Raoul [facility]itchBorawski [facility]

## 2016-07-17 DIAGNOSIS — L600 Ingrowing nail: Secondary | ICD-10-CM | POA: Diagnosis not present

## 2016-07-26 ENCOUNTER — Other Ambulatory Visit: Payer: Self-pay | Admitting: Family Medicine

## 2016-07-26 MED ORDER — MIDODRINE HCL 5 MG PO TABS: ORAL_TABLET | ORAL | 3 refills | Status: DC

## 2016-07-26 NOTE — Telephone Encounter (Signed)
Pt requesting refill on the following medication.pt also wants to change pharmacy to  [facility].   midodrine (PROAMATINE) 5 MG tablet

## 2016-08-01 DIAGNOSIS — L03031 Cellulitis of right toe: Secondary | ICD-10-CM | POA: Diagnosis not present

## 2016-08-02 ENCOUNTER — Encounter: Payer: Self-pay | Admitting: Physician Assistant

## 2016-08-02 ENCOUNTER — Ambulatory Visit (INDEPENDENT_AMBULATORY_CARE_PROVIDER_SITE_OTHER): Payer: Medicare Other | Admitting: Physician Assistant

## 2016-08-02 VITALS — BP 112/64 | HR 64 | Temp 98.3°F | Resp 16

## 2016-08-02 DIAGNOSIS — H00012 Hordeolum externum right lower eyelid: Secondary | ICD-10-CM

## 2016-08-02 DIAGNOSIS — Z0182 Encounter for allergy testing: Secondary | ICD-10-CM | POA: Diagnosis not present

## 2016-08-02 DIAGNOSIS — S14109S Unspecified injury at unspecified level of cervical spinal cord, sequela: Secondary | ICD-10-CM | POA: Diagnosis not present

## 2016-08-02 DIAGNOSIS — M62838 Other muscle spasm: Secondary | ICD-10-CM

## 2016-08-02 DIAGNOSIS — L03031 Cellulitis of right toe: Secondary | ICD-10-CM | POA: Insufficient documentation

## 2016-08-02 DIAGNOSIS — S129XXS Fracture of neck, unspecified, sequela: Secondary | ICD-10-CM

## 2016-08-02 MED ORDER — TIZANIDINE HCL 2 MG PO CAPS: 2.0000 mg | ORAL_CAPSULE | Freq: Three times a day (TID) | ORAL | 0 refills | Status: DC

## 2016-08-02 NOTE — Progress Notes (Signed)
Patient: Cassandra Lowe Female    DOB: [birth year]   26 y.o.   MRN: [id number] Visit Date: 08/03/2016  Today's Provider: Trey SailorsAdriana M Elisabella Hacker, PA-C   Chief Complaint  Patient presents with  . Conjunctivitis    Stye started about two nights ago.   Subjective:     Conjunctivitis   Associated symptoms include decreased vision, eye itching, sore throat, cough, eye pain and eye redness. Pertinent negatives include no fever, no double vision, no photophobia, no congestion, no ear discharge, no ear pain, no headaches, no hearing loss, no mouth sores, no rhinorrhea, no stridor, no wheezing and no eye discharge. The right eye is affected.   Encounter for Allergy Testing  Patient has had styes in the past, has had to have one removed by opthalmology. Concerned her cat has caused this and would like to proceed with allergy testing.   Spinal Cord Injury and Muscle Spasms  Patient has spinal cord injury 2/2 C5-C6 burst fracture a couple of years ago, and has had sequela since then. She has tried multiple muscle relaxers including Skelaxin, Robaxin, baclofen all without relief. She says Zanaflex is only one that worked for her, and she Is requesting refill until able to get [facility]. She is not taking any other muscle relaxers. She has tolerated Zanaflex at dose of 2 mg TID. She is on Keflex but not any antibiotic like Ciprofloxacin.  She is not pregnant.       Allergies  Allergen Reactions  . Latex     Other reaction(s): Other (See Comments) "Skin burns" Skin breakdown  . Morphine And Related Nausea And Vomiting  . Oxycodone Itching  . Tape     Other reaction(s): Other (See Comments) "Skin burns" Skin breakdown  . Tramadol Itching    Patient can take if she takes a benadryl with it.     Current Outpatient Prescriptions:  .  bisacodyl (MAGIC BULLETS) 10 MG suppository, Place 10 mg rectally daily as needed for mild constipation or moderate constipation. , Disp: , Rfl:  .   midodrine (PROAMATINE) 5 MG tablet, TAKE 2 TABLETS(10 MG) BY MOUTH THREE TIMES DAILY AS NEEDED (Patient not taking: Reported on 08/02/2016), Disp: 180 tablet, Rfl: 3 .  tizanidine (ZANAFLEX) 2 MG capsule, Take 1 capsule (2 mg total) by mouth 3 (three) times daily., Disp: 90 capsule, Rfl: 0  Review of Systems  Constitutional: Negative.  Negative for fever.  HENT: Positive for sore throat. Negative for congestion, dental problem, drooling, ear discharge, ear pain, facial swelling, hearing loss, mouth sores, nosebleeds, postnasal drip, rhinorrhea, sinus pain, sinus pressure, sneezing, tinnitus, trouble swallowing and voice change.   Eyes: Positive for pain, redness, itching and visual disturbance. Negative for double vision, photophobia and discharge.  Respiratory: Positive for cough. Negative for apnea, choking, chest tightness, shortness of breath, wheezing and stridor.   Allergic/Immunologic: Positive for environmental allergies.  Neurological: Negative for dizziness, light-headedness and headaches.    Social History  Substance Use Topics  . Smoking status: Never Smoker  . Smokeless tobacco: Never Used  . Alcohol use No   Objective:   BP 112/64 (BP Location: Right Arm, Patient Position: Sitting, Cuff Size: Normal)   Pulse 64   Temp 98.3 F (36.8 C) (Oral)   Resp 16  Vitals:   08/02/16 1629  BP: 112/64  Pulse: 64  Resp: 16  Temp: 98.3 F (36.8 C)  TempSrc: Oral     Physical Exam  Constitutional:  She is oriented to person, place, and time. She appears well-developed and well-nourished.  HENT:  Right Ear: External ear normal.  Left Ear: External ear normal.  Mouth/Throat: Oropharynx is clear and moist. No oropharyngeal exudate.  Eyes: Conjunctivae and EOM are normal. Pupils are equal, round, and reactive to light. Lids are everted and swept, no foreign bodies found. Right eye exhibits hordeolum. Right eye exhibits no discharge and no exudate. No foreign body present in the right  eye. Left eye exhibits no exudate and no hordeolum. No foreign body present in the left eye. Right conjunctiva is not injected. Right conjunctiva has no hemorrhage. Left conjunctiva is not injected. Left conjunctiva has no hemorrhage.  Neurological: She is alert and oriented to person, place, and time.  Skin: Skin is warm and dry.  Psychiatric: She has a normal mood and affect. Her behavior is normal.        Assessment & Plan:     1. Hordeolum externum of right lower eyelid  She has been doing warm compresses. This is the correct approach, continue with this and it should improve. Can try antibiotic ointment if not improving. She has f/u with Dr. Sherrie Mustache next week and can use this as second check.  2. Encounter for allergy testing  - Ambulatory referral to Allergy  3. Muscle spasms of lower extremity, unspecified laterality  Refilled for thirty days. Patient to call back if any adverse effects. Not taking other muscle relaxers.   - tizanidine (ZANAFLEX) 2 MG capsule; Take 1 capsule (2 mg total) by mouth 3 (three) times daily.  Dispense: 90 capsule; Refill: 0  4. Closed fracture of cervical vertebra with spinal cord injury, sequela (HCC)  See above.  Return if symptoms worsen or fail to improve.  The entirety of the information documented in the History of Present Illness, Review of Systems and Physical Exam were personally obtained by me. Portions of this information were initially documented by Kavin Leech, CMA and reviewed by me for thoroughness and accuracy.        Trey Sailors, PA-C  [facility]

## 2016-08-02 NOTE — Patient Instructions (Signed)

## 2016-08-04 MED ORDER — TIZANIDINE HCL 2 MG PO TABS
2.0000 mg | ORAL_TABLET | Freq: Three times a day (TID) | ORAL | 0 refills | Status: AC | PRN
Start: 2016-08-04 — End: 2016-09-03

## 2016-08-04 NOTE — Addendum Note (Signed)
Addended by: Trey SailorsPOLLAK, ADRIANA M on: 08/04/2016 04:50 PM   Modules accepted: Orders

## 2016-08-10 DIAGNOSIS — F3110 Bipolar disorder, current episode manic without psychotic features, unspecified: Secondary | ICD-10-CM | POA: Diagnosis not present

## 2016-08-10 DIAGNOSIS — N319 Neuromuscular dysfunction of bladder, unspecified: Secondary | ICD-10-CM | POA: Diagnosis not present

## 2016-08-10 DIAGNOSIS — Z681 Body mass index (BMI) 19 or less, adult: Secondary | ICD-10-CM | POA: Diagnosis not present

## 2016-08-10 DIAGNOSIS — G904 Autonomic dysreflexia: Secondary | ICD-10-CM | POA: Diagnosis not present

## 2016-08-10 DIAGNOSIS — G8250 Quadriplegia, unspecified: Secondary | ICD-10-CM | POA: Diagnosis not present

## 2016-08-10 DIAGNOSIS — S14109S Unspecified injury at unspecified level of cervical spinal cord, sequela: Secondary | ICD-10-CM | POA: Diagnosis not present

## 2016-08-11 ENCOUNTER — Ambulatory Visit: Payer: Medicare Other | Admitting: Family Medicine

## 2016-08-16 ENCOUNTER — Ambulatory Visit (INDEPENDENT_AMBULATORY_CARE_PROVIDER_SITE_OTHER): Payer: Medicare Other | Admitting: Family Medicine

## 2016-08-16 ENCOUNTER — Encounter: Payer: Self-pay | Admitting: Family Medicine

## 2016-08-16 VITALS — BP 102/60 | HR 80 | Temp 98.9°F | Resp 16

## 2016-08-16 DIAGNOSIS — N3001 Acute cystitis with hematuria: Secondary | ICD-10-CM | POA: Diagnosis not present

## 2016-08-16 DIAGNOSIS — S1201XS Stable burst fracture of first cervical vertebra, sequela: Secondary | ICD-10-CM

## 2016-08-16 DIAGNOSIS — G8250 Quadriplegia, unspecified: Secondary | ICD-10-CM

## 2016-08-16 DIAGNOSIS — H00012 Hordeolum externum right lower eyelid: Secondary | ICD-10-CM | POA: Diagnosis not present

## 2016-08-16 DIAGNOSIS — S129XXS Fracture of neck, unspecified, sequela: Secondary | ICD-10-CM

## 2016-08-16 LAB — POCT URINALYSIS DIPSTICK
Bilirubin, UA: NEGATIVE
Glucose, UA: NEGATIVE
KETONES UA: NEGATIVE
Nitrite, UA: POSITIVE
SPEC GRAV UA: 1.01 (ref 1.010–1.025)
UROBILINOGEN UA: 1 U/dL
pH, UA: 7 (ref 5.0–8.0)

## 2016-08-16 MED ORDER — CIPROFLOXACIN HCL 500 MG PO TABS: 500.0000 mg | ORAL_TABLET | Freq: Two times a day (BID) | ORAL | 0 refills | Status: AC

## 2016-08-16 MED ORDER — CIPROFLOXACIN HCL 0.3 % OP SOLN: 1.0000 [drp] | OPHTHALMIC | 0 refills | Status: AC

## 2016-08-16 NOTE — Progress Notes (Signed)
Patient: Cassandra Lowe Female    DOB: 04-08-1990   26 y.o.   MRN: [id number] Visit Date: 08/16/2016  Today's Provider: Mila Merry, MD   No chief complaint on file.  Subjective:    HPI Has trouble swallowing and was advised to have referall to Dr. Delford Field or Dr. Rubye Oaks [facility] (ENT)  Is also trying to get manual standing  Wants to see counselor     Allergies  Allergen Reactions  . Latex     Other reaction(s): Other (See Comments) "Skin burns" Skin breakdown  . Morphine And Related Nausea And Vomiting  . Oxycodone Itching  . Tape     Other reaction(s): Other (See Comments) "Skin burns" Skin breakdown  . Tramadol Itching    Patient can take if she takes a benadryl with it.     Current Outpatient Prescriptions:  .  bisacodyl (MAGIC BULLETS) 10 MG suppository, Place 10 mg rectally daily as needed for mild constipation or moderate constipation. , Disp: , Rfl:  .  midodrine (PROAMATINE) 5 MG tablet, TAKE 2 TABLETS(10 MG) BY MOUTH THREE TIMES DAILY AS NEEDED (Patient not taking: Reported on 08/02/2016), Disp: 180 tablet, Rfl: 3 .  norethindrone (MICRONOR,CAMILA,ERRIN) 0.35 MG tablet, Take 1 tablet by mouth daily., Disp: , Rfl:  .  tiZANidine (ZANAFLEX) 2 MG tablet, Take 1 tablet (2 mg total) by mouth 3 (three) times daily as needed for muscle spasms., Disp: 90 tablet, Rfl: 0  Review of Systems  Constitutional: Negative for appetite change, chills, fatigue and fever.  Respiratory: Negative for chest tightness and shortness of breath.   Cardiovascular: Negative for chest pain and palpitations.  Gastrointestinal: Negative for abdominal pain, nausea and vomiting.  Neurological: Negative for dizziness and weakness.    Social History  Substance Use Topics  . Smoking status: Never Smoker  . Smokeless tobacco: Never Used  . Alcohol use No   Objective:   BP 102/60   Pulse 80  There were no vitals filed for this visit.   Physical Exam   General  Appearance:    Alert, cooperative, no distress  Eyes:    PERRL, stye noted right lower eyelid, EOM's intact       Lungs:     Clear to auscultation bilaterally, respirations unlabored  Heart:    Regular rate and rhythm  Neurologic:   Awake, alert, oriented x 3. Confined to wheelchair. MS UEs+2 bilaterally. +3 reflexes.         Results for orders placed or performed in visit on 08/16/16  POCT urinalysis dipstick  Result Value Ref Range   Color, UA yellow    Clarity, UA cloudy    Glucose, UA negative    Bilirubin, UA negative    Ketones, UA negative    Spec Grav, UA 1.010 1.010 - 1.025   Blood, UA non hemolyzed moderate    pH, UA 7.0 5.0 - 8.0   Protein, UA trace    Urobilinogen, UA 1.0 0.2 or 1.0 E.U./dL   Nitrite, UA positive    Leukocytes, UA Large (3+) (A) Negative       Assessment & Plan:     1. Hordeolum externum of right lower eyelid  - ciprofloxacin (CILOXAN) 0.3 % ophthalmic solution; Place 1 drop into both eyes every 4 (four) hours while awake.  Dispense: 5 mL; Refill: 0  2. Acute cystitis with hematuria  - Urine Culture - POCT urinalysis dipstick - ciprofloxacin (CIPRO) 500 MG tablet; Take 1  tablet (500 mg total) by mouth 2 (two) times daily.  Dispense: 14 tablet; Refill: 0  3. Quadriplegia, post-traumatic (HCC)   4. Burst fracture of cervical vertebra, sequela        Mila Merryonald Keaundre Thelin, MD  [facility]

## 2016-08-17 ENCOUNTER — Telehealth: Payer: Self-pay | Admitting: Family Medicine

## 2016-08-17 NOTE — Telephone Encounter (Signed)
Patient advised that urine culture not back yet. She would like to get notified when we do get them and to fax results over to Dr Raoul PitchBorawski as below. Thank Sylvester Harderyou-aa  Borawski, Kristy McKiernan, MD  [address]  Surgery  ZO[address][id number]  [place], [place] [id number]  [phone]  [phone] (Fax)

## 2016-08-17 NOTE — Telephone Encounter (Signed)
Pt called for UA test results that were taken yesterday.  Call back Is [phone] or [phone]  Thanks Barth Kirksteri

## 2016-08-19 LAB — URINE CULTURE

## 2016-08-20 ENCOUNTER — Other Ambulatory Visit: Payer: Self-pay | Admitting: Family Medicine

## 2016-08-20 MED ORDER — SULFAMETHOXAZOLE-TRIMETHOPRIM 800-160 MG PO TABS: 1.0000 | ORAL_TABLET | Freq: Two times a day (BID) | ORAL | 0 refills | Status: DC

## 2016-08-21 NOTE — Telephone Encounter (Signed)
Results faxed.

## 2016-08-21 NOTE — Telephone Encounter (Signed)
See results and send copy of culture to Dr Raoul PitchBorawski as below.

## 2016-08-29 DIAGNOSIS — Z681 Body mass index (BMI) 19 or less, adult: Secondary | ICD-10-CM | POA: Diagnosis not present

## 2016-08-29 DIAGNOSIS — K592 Neurogenic bowel, not elsewhere classified: Secondary | ICD-10-CM | POA: Diagnosis not present

## 2016-08-29 DIAGNOSIS — R1310 Dysphagia, unspecified: Secondary | ICD-10-CM | POA: Diagnosis not present

## 2016-09-08 ENCOUNTER — Encounter: Payer: Self-pay | Admitting: Family Medicine

## 2016-09-08 DIAGNOSIS — N319 Neuromuscular dysfunction of bladder, unspecified: Secondary | ICD-10-CM | POA: Insufficient documentation

## 2016-09-11 ENCOUNTER — Telehealth: Payer: Self-pay | Admitting: Family Medicine

## 2016-09-11 NOTE — Telephone Encounter (Signed)
Pt called needing an order for a manual wheel chair.  She will need Dr. Sherrie MustacheFisher to call and request.  The phone number is [phone]  Pt's call back [phone]   These instruction are per medicaid.  Thanks Fortune Brandsteri

## 2016-09-11 NOTE — Telephone Encounter (Signed)
Please review

## 2016-09-14 NOTE — Telephone Encounter (Signed)
Called  The phone number is [phone]. Representative said this is [employer] PA department for durable equipment. They need to know where patient is going to or plan on getting the wheelchair from so they can put it in the system and know where to file this request with, before we could go over any other questions they needed to know that part. I advised patient of this, she said Medicaid would get it or occupational therapy but I advised patient to see if there is a certain place they will be purchasing this from. Patient will call back about this with the information. [facility] advised me that provider or the MA can go online to [place] track and fill out all the information needed that way for PA for the wheelchair.-aa

## 2016-09-14 NOTE — Telephone Encounter (Signed)
Patient needs manual wheelchair for traumatic quadriplegia. OK to call in. Can fax written order if they need one.

## 2016-09-14 NOTE — Telephone Encounter (Signed)
FYI-aa waiting to hear back from patient-aa

## 2016-09-15 NOTE — Telephone Encounter (Signed)
Order written, please fax. Thanks.

## 2016-09-15 NOTE — Telephone Encounter (Signed)
Pt returned Ana's call. Pt stated that she would like to use [facility] to get a manual wheelchair with power assist wheels due to her weak upper body strength. [facility] Office# [phone]  Fax# [phone]. Please advise. Thanks TNP

## 2016-09-15 NOTE — Telephone Encounter (Signed)
Rx was faxed.

## 2016-09-19 NOTE — Telephone Encounter (Signed)
Returned call to pt. Patient needs our office to contact [place] track to inform them that she will be using [facility] to receive the manual wheelchair.

## 2016-09-19 NOTE — Telephone Encounter (Signed)
Pt is requesting a call back about this.  GN#[phone]/QICB#[phone]/MW

## 2016-09-20 DIAGNOSIS — R339 Retention of urine, unspecified: Secondary | ICD-10-CM | POA: Diagnosis not present

## 2016-09-20 DIAGNOSIS — Z87440 Personal history of urinary (tract) infections: Secondary | ICD-10-CM | POA: Diagnosis not present

## 2016-09-20 DIAGNOSIS — G8250 Quadriplegia, unspecified: Secondary | ICD-10-CM | POA: Diagnosis not present

## 2016-09-21 ENCOUNTER — Other Ambulatory Visit: Payer: Self-pay | Admitting: Family Medicine

## 2016-09-21 MED ORDER — MIDODRINE HCL 5 MG PO TABS: ORAL_TABLET | ORAL | 3 refills | Status: DC

## 2016-09-21 NOTE — Telephone Encounter (Signed)
Please review. Thanks!  

## 2016-09-21 NOTE — Telephone Encounter (Signed)
Pt contacted office for refill request on the following medications:  midodrine (PROAMATINE) 5 MG tablet   [facility] [place],  ZO#[phone]/WJCB#[phone]/MW

## 2016-09-21 NOTE — Telephone Encounter (Signed)
Per [facility] they are processing pt's order now. They are going to call pt to let her know. We do not need to do a PA.

## 2016-09-27 DIAGNOSIS — R1314 Dysphagia, pharyngoesophageal phase: Secondary | ICD-10-CM | POA: Diagnosis not present

## 2016-09-27 DIAGNOSIS — Z87828 Personal history of other (healed) physical injury and trauma: Secondary | ICD-10-CM | POA: Diagnosis not present

## 2016-09-28 ENCOUNTER — Encounter: Payer: Self-pay | Admitting: *Deleted

## 2016-10-03 DIAGNOSIS — G8250 Quadriplegia, unspecified: Secondary | ICD-10-CM | POA: Diagnosis not present

## 2016-10-03 DIAGNOSIS — Z87440 Personal history of urinary (tract) infections: Secondary | ICD-10-CM | POA: Diagnosis not present

## 2016-10-03 DIAGNOSIS — R339 Retention of urine, unspecified: Secondary | ICD-10-CM | POA: Diagnosis not present

## 2016-10-12 ENCOUNTER — Telehealth: Payer: Self-pay | Admitting: Family Medicine

## 2016-10-12 NOTE — Telephone Encounter (Signed)
Patient advised. She states she will drop a urine sample off tomorrow morning.

## 2016-10-12 NOTE — Telephone Encounter (Signed)
pt wants to drop off a urine specimen.  She thinks she has another UTI.  Her call back is    [phone]  Thanks Barth Kirksteri

## 2016-10-12 NOTE — Telephone Encounter (Signed)
Dr. Sherrie MustacheFisher, is it ok for patient to drop off a urine specimen to send for culture? We advised her that she needs an appointment. Patient states she has to cath herself and usually just drops a urine sample off.

## 2016-10-12 NOTE — Telephone Encounter (Signed)
That's fine

## 2016-10-13 ENCOUNTER — Other Ambulatory Visit: Payer: Self-pay | Admitting: Family Medicine

## 2016-10-13 ENCOUNTER — Other Ambulatory Visit: Payer: Self-pay | Admitting: *Deleted

## 2016-10-13 DIAGNOSIS — N390 Urinary tract infection, site not specified: Secondary | ICD-10-CM | POA: Diagnosis not present

## 2016-10-13 DIAGNOSIS — R319 Hematuria, unspecified: Secondary | ICD-10-CM | POA: Diagnosis not present

## 2016-10-13 DIAGNOSIS — R339 Retention of urine, unspecified: Secondary | ICD-10-CM | POA: Diagnosis not present

## 2016-10-13 LAB — POCT URINALYSIS DIPSTICK
BILIRUBIN UA: NEGATIVE
GLUCOSE UA: NEGATIVE
KETONES UA: NEGATIVE
NITRITE UA: POSITIVE
Protein, UA: NEGATIVE
SPEC GRAV UA: 1.01 (ref 1.010–1.025)
UROBILINOGEN UA: 0.2 U/dL
pH, UA: 7.5 (ref 5.0–8.0)

## 2016-10-13 MED ORDER — SULFAMETHOXAZOLE-TRIMETHOPRIM 800-160 MG PO TABS: 1.0000 | ORAL_TABLET | Freq: Two times a day (BID) | ORAL | 0 refills | Status: AC

## 2016-10-16 LAB — URINE CULTURE
MICRO NUMBER: 80986210
SPECIMEN QUALITY:: ADEQUATE

## 2016-10-17 DIAGNOSIS — R1310 Dysphagia, unspecified: Secondary | ICD-10-CM | POA: Diagnosis not present

## 2016-10-17 DIAGNOSIS — R1312 Dysphagia, oropharyngeal phase: Secondary | ICD-10-CM | POA: Diagnosis not present

## 2016-10-17 DIAGNOSIS — R633 Feeding difficulties: Secondary | ICD-10-CM | POA: Diagnosis not present

## 2016-10-19 DIAGNOSIS — S14109S Unspecified injury at unspecified level of cervical spinal cord, sequela: Secondary | ICD-10-CM | POA: Diagnosis not present

## 2016-10-19 DIAGNOSIS — G8250 Quadriplegia, unspecified: Secondary | ICD-10-CM | POA: Diagnosis not present

## 2016-10-23 DIAGNOSIS — R339 Retention of urine, unspecified: Secondary | ICD-10-CM | POA: Diagnosis not present

## 2016-10-26 DIAGNOSIS — N319 Neuromuscular dysfunction of bladder, unspecified: Secondary | ICD-10-CM | POA: Diagnosis not present

## 2016-11-20 DIAGNOSIS — R339 Retention of urine, unspecified: Secondary | ICD-10-CM | POA: Diagnosis not present

## 2016-12-05 DIAGNOSIS — Z87828 Personal history of other (healed) physical injury and trauma: Secondary | ICD-10-CM | POA: Diagnosis not present

## 2016-12-05 DIAGNOSIS — N319 Neuromuscular dysfunction of bladder, unspecified: Secondary | ICD-10-CM | POA: Diagnosis not present

## 2016-12-13 DIAGNOSIS — G8250 Quadriplegia, unspecified: Secondary | ICD-10-CM | POA: Diagnosis not present

## 2016-12-13 DIAGNOSIS — S14109S Unspecified injury at unspecified level of cervical spinal cord, sequela: Secondary | ICD-10-CM | POA: Diagnosis not present

## 2016-12-20 DIAGNOSIS — R339 Retention of urine, unspecified: Secondary | ICD-10-CM | POA: Diagnosis not present

## 2016-12-25 ENCOUNTER — Other Ambulatory Visit: Payer: Self-pay | Admitting: Family Medicine

## 2016-12-25 MED ORDER — MIDODRINE HCL 5 MG PO TABS: ORAL_TABLET | ORAL | 4 refills | Status: DC

## 2016-12-25 NOTE — Telephone Encounter (Signed)
Please review. Thanks!  

## 2016-12-25 NOTE — Telephone Encounter (Signed)
Pt request refill of Midodrine 5 mg sent to [facility]

## 2017-01-13 DIAGNOSIS — R339 Retention of urine, unspecified: Secondary | ICD-10-CM | POA: Diagnosis not present

## 2017-02-01 DIAGNOSIS — S14105S Unspecified injury at C5 level of cervical spinal cord, sequela: Secondary | ICD-10-CM | POA: Diagnosis not present

## 2017-02-01 DIAGNOSIS — Z993 Dependence on wheelchair: Secondary | ICD-10-CM | POA: Diagnosis not present

## 2017-02-01 DIAGNOSIS — R1310 Dysphagia, unspecified: Secondary | ICD-10-CM | POA: Diagnosis not present

## 2017-02-01 DIAGNOSIS — M62838 Other muscle spasm: Secondary | ICD-10-CM | POA: Diagnosis not present

## 2017-02-13 DIAGNOSIS — R339 Retention of urine, unspecified: Secondary | ICD-10-CM | POA: Diagnosis not present

## 2017-02-23 ENCOUNTER — Telehealth: Payer: Self-pay

## 2017-02-23 NOTE — Telephone Encounter (Signed)
If she has a sterile container to collect urine, or, collects the specimen in the office, we can check for UTI Monday.

## 2017-02-23 NOTE — Telephone Encounter (Signed)
Patient is going to come and see Lisabeth PickDennis Chrismon,PA on Monday 02/26/17 for eye issue and she states that she usually calls and ask if she can drop of her urine for culture, she thinks she has a urinary infection again, she uses catheter to collect this. IS it ok to do at the time of the appointment then?-Esma Kilts V Reanna Scoggin, RMA

## 2017-02-23 NOTE — Telephone Encounter (Signed)
Should be fine.

## 2017-02-23 NOTE — Telephone Encounter (Signed)
When I spoke to her earlier she said she usually brings a bag that is attached to the catheter to have it checked from the bag. -Consuella LoseAnastasiya V Sokha Craker, RMA

## 2017-02-26 ENCOUNTER — Ambulatory Visit: Payer: Medicare Other | Admitting: Family Medicine

## 2017-02-26 NOTE — Progress Notes (Deleted)
      Patient: Cassandra GaulGabrielle L Brucato Female    DOB: 1990/03/22   27 y.o.   MRN: [id number] Visit Date: 02/26/2017  Today's Provider: Dortha Kernennis Chrismon, PA   No chief complaint on file.  Subjective:    Eye Problem    Patient has a PMH of hordeolum externum of right lower eyelid.     Allergies  Allergen Reactions  . Morphine And Related Nausea And Vomiting and Hives  . Oxycodone Itching  . Tape     Other reaction(s): Other (See Comments) "Skin burns" Skin breakdown  . Latex     Other reaction(s): Other (See Comments) "Skin burns" Skin breakdown Other reaction(s): Other (See Comments) blisters  . Tramadol Itching    Patient can take if she takes a benadryl with it. Patient can take if she takes a benadryl with it.      Current Outpatient Medications:  .  bisacodyl (MAGIC BULLETS) 10 MG suppository, Place 10 mg rectally daily as needed for mild constipation or moderate constipation. , Disp: , Rfl:  .  midodrine (PROAMATINE) 5 MG tablet, TAKE 2 TABLETS(10 MG) BY MOUTH THREE TIMES DAILY AS NEEDED, Disp: 180 tablet, Rfl: 4 .  norethindrone (MICRONOR,CAMILA,ERRIN) 0.35 MG tablet, Take 1 tablet by mouth daily., Disp: , Rfl:   Review of Systems  Constitutional: Negative.   Respiratory: Negative.   Cardiovascular: Negative.     Social History   Tobacco Use  . Smoking status: Never Smoker  . Smokeless tobacco: Never Used  Substance Use Topics  . Alcohol use: No    Alcohol/week: 0.0 oz   Objective:   There were no vitals taken for this visit.   Physical Exam      Assessment & Plan:           Dortha Kernennis Chrismon, PA  [facility]

## 2017-03-06 ENCOUNTER — Other Ambulatory Visit: Payer: Self-pay

## 2017-03-06 ENCOUNTER — Other Ambulatory Visit: Payer: Self-pay | Admitting: Family Medicine

## 2017-03-06 ENCOUNTER — Telehealth: Payer: Self-pay

## 2017-03-06 ENCOUNTER — Telehealth (INDEPENDENT_AMBULATORY_CARE_PROVIDER_SITE_OTHER): Payer: Medicare Other | Admitting: Family Medicine

## 2017-03-06 DIAGNOSIS — N390 Urinary tract infection, site not specified: Secondary | ICD-10-CM

## 2017-03-06 DIAGNOSIS — R319 Hematuria, unspecified: Principal | ICD-10-CM

## 2017-03-06 LAB — POCT URINALYSIS DIPSTICK
BILIRUBIN UA: NEGATIVE
GLUCOSE UA: NEGATIVE
Nitrite, UA: POSITIVE
Protein, UA: 30
SPEC GRAV UA: 1.01 (ref 1.010–1.025)
Urobilinogen, UA: 0.2 E.U./dL
pH, UA: 8 (ref 5.0–8.0)

## 2017-03-06 MED ORDER — SULFAMETHOXAZOLE-TRIMETHOPRIM 800-160 MG PO TABS: 1.0000 | ORAL_TABLET | Freq: Two times a day (BID) | ORAL | 0 refills | Status: DC

## 2017-03-06 NOTE — Telephone Encounter (Signed)
Pt's caregiver eric is requesting if he can drop off a urine sample for pt to be tested for UTI. Please advise. Thanks TNP

## 2017-03-06 NOTE — Telephone Encounter (Signed)
-----   Message from Malva Limesonald E Fisher, MD sent at 03/06/2017  9:58 AM EST ----- U/a consistent with UTI, please send prescription for septra DS one tablet BID for 10 days to pharmacy of her choice.

## 2017-03-06 NOTE — Telephone Encounter (Signed)
I called Cassandra Lowe back to ask what symptoms the patient was having. Cassandra Lowe states that her urine is cloudy and has a strong odor. I consulted with Dr. Sherrie MustacheFisher who gave the "ok" for Cassandra Lowe to drop off patient urine sample. Order placed for UA and urine culture.

## 2017-03-06 NOTE — Telephone Encounter (Signed)
Called and advised patients caregiver Minerva Areolaric of results. Eric verbalized understanding. [facility].

## 2017-03-09 ENCOUNTER — Telehealth: Payer: Self-pay | Admitting: Emergency Medicine

## 2017-03-09 DIAGNOSIS — N390 Urinary tract infection, site not specified: Secondary | ICD-10-CM

## 2017-03-09 MED ORDER — AMOXICILLIN-POT CLAVULANATE 875-125 MG PO TABS: 1.0000 | ORAL_TABLET | Freq: Two times a day (BID) | ORAL | 0 refills | Status: DC

## 2017-03-09 NOTE — Telephone Encounter (Signed)
-----   Message from Malva Limesonald E Fisher, MD sent at 03/08/2017  9:16 PM EST ----- uti is resistant to sulfa. Need to change to Augmentin 875 one tablet BID for 7 days.

## 2017-03-09 NOTE — Telephone Encounter (Signed)
Pt advised of results and medication sent in. Pt was upset because she has to get another antibiotic that was not the right one to treat her UTI. She says this has happened a lot and believes she is becoming resistant to antibiotics and wants to be sure the antibiotic she is taking is the correct antibiotic in the future.

## 2017-03-12 ENCOUNTER — Ambulatory Visit (INDEPENDENT_AMBULATORY_CARE_PROVIDER_SITE_OTHER): Payer: Medicare Other

## 2017-03-12 VITALS — BP 86/64 | HR 68 | Temp 97.4°F | Ht 65.0 in | Wt 87.0 lb

## 2017-03-12 DIAGNOSIS — Z0000 Encounter for general adult medical examination without abnormal findings: Secondary | ICD-10-CM

## 2017-03-12 NOTE — Patient Instructions (Signed)
Cassandra Lowe , Thank you for taking time to come for your Medicare Wellness Visit. I appreciate your ongoing commitment to your health goals. Please review the following plan we discussed and let me know if I can assist you in the future.   Screening recommendations/referrals: Colonoscopy: N/A Mammogram: N/A Bone Density: N/A Recommended yearly ophthalmology/optometry visit for glaucoma screening and checkup Recommended yearly dental visit for hygiene and checkup  Vaccinations: Influenza vaccine: Pt declines today.  Pneumococcal vaccine: N/A Tdap vaccine: Up to date Shingles vaccine: N/A    Advanced directives: Advance directive discussed with you today. I have provided a copy for you to complete at home and have notarized. Once this is complete please bring a copy in to our office so we can scan it into your chart.  Conditions/risks identified: Recommend increasing water intake.   Next appointment: 03/14/17 @ 3:00 PM  Preventive Care 40-64 Years, Female Preventive care refers to lifestyle choices and visits with your health care provider that can promote health and wellness. What does preventive care include?  A yearly physical exam. This is also called an annual well check.  Dental exams once or twice a year.  Routine eye exams. Ask your health care provider how often you should have your eyes checked.  Personal lifestyle choices, including:  Daily care of your teeth and gums.  Regular physical activity.  Eating a healthy diet.  Avoiding tobacco and drug use.  Limiting alcohol use.  Practicing safe sex.  Taking low-dose aspirin daily starting at age 53.  Taking vitamin and mineral supplements as recommended by your health care provider. What happens during an annual well check? The services and screenings done by your health care provider during your annual well check will depend on your age, overall health, lifestyle risk factors, and family history of  disease. Counseling  Your health care provider may ask you questions about your:  Alcohol use.  Tobacco use.  Drug use.  Emotional well-being.  Home and relationship well-being.  Sexual activity.  Eating habits.  Work and work Statistician.  Method of birth control.  Menstrual cycle.  Pregnancy history. Screening  You may have the following tests or measurements:  Height, weight, and BMI.  Blood pressure.  Lipid and cholesterol levels. These may be checked every 5 years, or more frequently if you are over 23 years old.  Skin check.  Lung cancer screening. You may have this screening every year starting at age 22 if you have a 30-pack-year history of smoking and currently smoke or have quit within the past 15 years.  Fecal occult blood test (FOBT) of the stool. You may have this test every year starting at age 63.  Flexible sigmoidoscopy or colonoscopy. You may have a sigmoidoscopy every 5 years or a colonoscopy every 10 years starting at age 42.  Hepatitis C blood test.  Hepatitis B blood test.  Sexually transmitted disease (STD) testing.  Diabetes screening. This is done by checking your blood sugar (glucose) after you have not eaten for a while (fasting). You may have this done every 1-3 years.  Mammogram. This may be done every 1-2 years. Talk to your health care provider about when you should start having regular mammograms. This may depend on whether you have a family history of breast cancer.  BRCA-related cancer screening. This may be done if you have a family history of breast, ovarian, tubal, or peritoneal cancers.  Pelvic exam and Pap test. This may be done every 3 years  starting at age 69. Starting at age 33, this may be done every 5 years if you have a Pap test in combination with an HPV test.  Bone density scan. This is done to screen for osteoporosis. You may have this scan if you are at high risk for osteoporosis. Discuss your test results,  treatment options, and if necessary, the need for more tests with your health care provider. Vaccines  Your health care provider may recommend certain vaccines, such as:  Influenza vaccine. This is recommended every year.  Tetanus, diphtheria, and acellular pertussis (Tdap, Td) vaccine. You may need a Td booster every 10 years.  Zoster vaccine. You may need this after age 11.  Pneumococcal 13-valent conjugate (PCV13) vaccine. You may need this if you have certain conditions and were not previously vaccinated.  Pneumococcal polysaccharide (PPSV23) vaccine. You may need one or two doses if you smoke cigarettes or if you have certain conditions. Talk to your health care provider about which screenings and vaccines you need and how often you need them. This information is not intended to replace advice given to you by your health care provider. Make sure you discuss any questions you have with your health care provider. Document Released: 02/19/2015 Document Revised: 10/13/2015 Document Reviewed: 11/24/2014 Elsevier Interactive Patient Education  2017 [place] Prevention in the Home Falls can cause injuries. They can happen to people of all ages. There are many things you can do to make your home safe and to help prevent falls. What can I do on the outside of my home?  Regularly fix the edges of walkways and driveways and fix any cracks.  Remove anything that might make you trip as you walk through a door, such as a raised step or threshold.  Trim any bushes or trees on the path to your home.  Use bright outdoor lighting.  Clear any walking paths of anything that might make someone trip, such as rocks or tools.  Regularly check to see if handrails are loose or broken. Make sure that both sides of any steps have handrails.  Any raised decks and porches should have guardrails on the edges.  Have any leaves, snow, or ice cleared regularly.  Use sand or salt on walking  paths during winter.  Clean up any spills in your garage right away. This includes oil or grease spills. What can I do in the bathroom?  Use night lights.  Install grab bars by the toilet and in the tub and shower. Do not use towel bars as grab bars.  Use non-skid mats or decals in the tub or shower.  If you need to sit down in the shower, use a plastic, non-slip stool.  Keep the floor dry. Clean up any water that spills on the floor as soon as it happens.  Remove soap buildup in the tub or shower regularly.  Attach bath mats securely with double-sided non-slip rug tape.  Do not have throw rugs and other things on the floor that can make you trip. What can I do in the bedroom?  Use night lights.  Make sure that you have a light by your bed that is easy to reach.  Do not use any sheets or blankets that are too big for your bed. They should not hang down onto the floor.  Have a firm chair that has side arms. You can use this for support while you get dressed.  Do not have throw rugs and  other things on the floor that can make you trip. What can I do in the kitchen?  Clean up any spills right away.  Avoid walking on wet floors.  Keep items that you use a lot in easy-to-reach places.  If you need to reach something above you, use a strong step stool that has a grab bar.  Keep electrical cords out of the way.  Do not use floor polish or wax that makes floors slippery. If you must use wax, use non-skid floor wax.  Do not have throw rugs and other things on the floor that can make you trip. What can I do with my stairs?  Do not leave any items on the stairs.  Make sure that there are handrails on both sides of the stairs and use them. Fix handrails that are broken or loose. Make sure that handrails are as long as the stairways.  Check any carpeting to make sure that it is firmly attached to the stairs. Fix any carpet that is loose or worn.  Avoid having throw rugs at  the top or bottom of the stairs. If you do have throw rugs, attach them to the floor with carpet tape.  Make sure that you have a light switch at the top of the stairs and the bottom of the stairs. If you do not have them, ask someone to add them for you. What else can I do to help prevent falls?  Wear shoes that:  Do not have high heels.  Have rubber bottoms.  Are comfortable and fit you well.  Are closed at the toe. Do not wear sandals.  If you use a stepladder:  Make sure that it is fully opened. Do not climb a closed stepladder.  Make sure that both sides of the stepladder are locked into place.  Ask someone to hold it for you, if possible.  Clearly mark and make sure that you can see:  Any grab bars or handrails.  First and last steps.  Where the edge of each step is.  Use tools that help you move around (mobility aids) if they are needed. These include:  Canes.  Walkers.  Scooters.  Crutches.  Turn on the lights when you go into a dark area. Replace any light bulbs as soon as they burn out.  Set up your furniture so you have a clear path. Avoid moving your furniture around.  If any of your floors are uneven, fix them.  If there are any pets around you, be aware of where they are.  Review your medicines with your doctor. Some medicines can make you feel dizzy. This can increase your chance of falling. Ask your doctor what other things that you can do to help prevent falls. This information is not intended to replace advice given to you by your health care provider. Make sure you discuss any questions you have with your health care provider. Document Released: 11/19/2008 Document Revised: 07/01/2015 Document Reviewed: 02/27/2014 Elsevier Interactive Patient Education  2017 [employer].

## 2017-03-12 NOTE — Progress Notes (Signed)
Subjective:   Cassandra Lowe is a 27 y.o. female who presents for an Initial Medicare Annual Wellness Visit.  Review of Systems    N/A  Cardiac Risk Factors include: advanced age (>10men, >49 women)     Objective:    Today's Vitals   03/12/17 1329 03/12/17 1345  BP:  (!) 86/64  Pulse: 68   Temp: (!) 97.4 F (36.3 C)   TempSrc: Oral   Weight: 87 lb (39.5 kg)   Height: 5\' 5"  (1.651 m)   PainSc: 0-No pain 3    Body mass index is 14.48 kg/m.  Advanced Directives 03/12/2017 07/24/2015  Does Patient Have a Medical Advance Directive? No No  Would patient like information on creating a medical advance directive? Yes (MAU/Ambulatory/Procedural Areas - Information given) No - patient declined information    Current Medications (verified) Outpatient Encounter Medications as of 03/12/2017  Medication Sig  . amoxicillin-clavulanate (AUGMENTIN) 875-125 MG tablet Take 1 tablet by mouth 2 (two) times daily. (Patient taking differently: Take 1 tablet by mouth daily. )  . baclofen (LIORESAL) 10 MG tablet Take 5 mg by mouth at bedtime.  . bisacodyl (MAGIC BULLETS) 10 MG suppository Place 10 mg rectally daily as needed for mild constipation or moderate constipation.   . Ginger, Zingiber officinalis, (GINGER PO) Take by mouth as needed.  . midodrine (PROAMATINE) 5 MG tablet TAKE 2 TABLETS(10 MG) BY MOUTH THREE TIMES DAILY AS NEEDED  . [DISCONTINUED] norethindrone (MICRONOR,CAMILA,ERRIN) 0.35 MG tablet Take 1 tablet by mouth daily.   No facility-administered encounter medications on file as of 03/12/2017.     Allergies (verified) Morphine and related; Oxycodone; Tape; Latex; and Tramadol   History: Past Medical History:  Diagnosis Date  . Burst fracture of cervical vertebra (HCC)   . History of chicken pox   . Quadriplegia [facility])    Past Surgical History:  Procedure Laterality Date  . C4-C6 cervical fusion with Decompression or C5 burst fracture  02/12/2014  . MOUTH SURGERY     one tooth removed  . Reversal of Tracheostomy  03/22/2014  . TRACHEOSTOMY  02/26/2014   pallative tracheostomy for respiratory failure secondary to C-spine fracture and paraplegia   Family History  Problem Relation Age of Onset  . Ovarian cysts Mother   . Dysmenorrhea Mother   . Diabetes Mother        type 2  . Bipolar disorder Brother   . Schizophrenia Brother   . Hypertension Other   . Diabetes Other    Social History   Socioeconomic History  . Marital status: Single    Spouse name: None  . Number of children: 0  . Years of education: None  . Highest education level: Bachelor's degree (e.g., BA, AB, BS)  Social Needs  . Financial resource strain: Somewhat hard  . Food insecurity - worry: Never true  . Food insecurity - inability: Never true  . Transportation needs - medical: No  . Transportation needs - non-medical: No  Occupational History  . Occupation: disabled  Tobacco Use  . Smoking status: Never Smoker  . Smokeless tobacco: Never Used  Substance and Sexual Activity  . Alcohol use: No    Alcohol/week: 0.0 oz  . Drug use: No  . Sexual activity: None    Comment: sexually active at 27 years of age; has had one sexual partner. Used condoms  Other Topics Concern  . None  Social History Narrative  . None    Tobacco Counseling Counseling given: Not  Answered   Clinical Intake:  Pre-visit preparation completed: Yes  Pain : 0-10 Pain Score: 3 (or 4) Pain Type: Chronic pain Pain Location: Neck(shoulder and back) Pain Descriptors / Indicators: Aching, Constant, Dull, Sharp     Nutritional Status: BMI <19  Underweight Diabetes: No  How often do you need to have someone help you when you read instructions, pamphlets, or other written materials from your doctor or pharmacy?: 1 - Never  Interpreter Needed?: No  Information entered by :: [facility], LPN   Activities of Daily Living In your present state of health, do you have any difficulty performing  the following activities: 03/12/2017  Hearing? N  Vision? N  Difficulty concentrating or making decisions? N  Walking or climbing stairs? 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 Y  Comment due to being paralyzed   Doing errands, shopping? Y  Preparing Food and eating ? 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 Y  Comment has to use a catheter and bowel program  In the past six months, have you accidently leaked urine? N  Do you have problems with loss of bowel control? N  Managing your Medications? Y  Managing your Finances? N  Housekeeping or managing your Housekeeping? Y  Some recent data might be hidden     Immunizations and Health Maintenance Immunization History  Administered Date(s) Administered  . HPV Quadrivalent 10/20/2009, 01/18/2010, 05/25/2010  . Hepatitis A 10/20/2009, 05/25/2010  . Meningococcal Conjugate 10/20/2009  . Tdap 10/20/2009   Health Maintenance Due  Topic Date Due  . HIV Screening  05/26/2007    Patient Care Team: Malva Limes, MD as PCP - General (Family Medicine)  Indicate any recent Medical Services you may have received from other than [facility] providers in the past year (date may be approximate).     Assessment:   This is a routine wellness examination for [place].  Hearing/Vision screen Vision Screening Comments: Pt goes to [facility] for vision checks as needed.   Dietary issues and exercise activities discussed: Current Exercise Habits: Home exercise routine, Type of exercise: stretching;Other - see comments(resistance training), Time (Minutes): > 60(1 hour and 15 minutes), Frequency (Times/Week): 2, Weekly Exercise (Minutes/Week): 0, Intensity: Mild, Exercise limited by: Other - see comments(paralyzed)  Goals    . DIET - INCREASE WATER INTAKE     Recommend increasing water intake to 4-6 glasses a day.       Depression Screen PHQ 2/9 Scores 03/12/2017  PHQ - 2 Score 3  PHQ- 9 Score 13      Fall Risk Fall Risk  03/12/2017  Falls in the past year? Yes  Number falls in past yr: 1  Injury with Fall? No    Is the patient's home free of loose throw rugs in walkways, pet beds, electrical cords, etc?   yes      Grab bars in the bathroom? no      Handrails on the stairs? N/A      Adequate lighting?   yes  Timed Get Up and Go Performed N/A  Cognitive Function: N/A        Screening Tests Health Maintenance  Topic Date Due  . HIV Screening  05/26/2007  . INFLUENZA VACCINE  05/06/2017 (Originally 09/06/2016)  . PAP SMEAR  06/09/2019  . TETANUS/TDAP  10/21/2019    Qualifies for Shingles Vaccine? N/A  Cancer Screenings: Lung: Low Dose CT Chest recommended if Age 32-80 years,  30 pack-year currently smoking OR have quit w/in 15years. Patient does not qualify. Breast: Up to date on Mammogram? N/A   Up to date of Bone Density/Dexa? N/A Colorectal: N/A  Additional Screenings:  Hepatitis B/HIV/Syphillis: Pt declines today.  Hepatitis C Screening: N/A     Plan:  I have personally reviewed and addressed the Medicare Annual Wellness questionnaire and have noted the following in the patient's chart:  A. Medical and social history B. Use of alcohol, tobacco or illicit drugs  C. Current medications and supplements D. Functional ability and status E.  Nutritional status F.  Physical activity G. Advance directives H. List of other physicians I.  Hospitalizations, surgeries, and ER visits in previous 12 months J.  Vitals K. Screenings such as hearing and vision if needed, cognitive and depression L. Referrals and appointments - none  In addition, I have reviewed and discussed with patient certain preventive protocols, quality metrics, and best practice recommendations. A written personalized care plan for preventive services as well as general preventive health recommendations were provided to patient.  See attached scanned questionnaire for additional information.    Signed,  Hyacinth MeekerMckenzie Alexandrea Westergard, LPN Nurse Health Advisor   Nurse Recommendations: Pt to check insurance and see about coverage for HIV and syphilis lab screenings. Referral sent to careguide for additional assistance with transportation, need of a new caregiver, PT and therapist. Pt would like to talk further with PCP regarding weight loss and options to help aid in gaining weight.

## 2017-03-14 ENCOUNTER — Ambulatory Visit (INDEPENDENT_AMBULATORY_CARE_PROVIDER_SITE_OTHER): Payer: Medicare Other | Admitting: Family Medicine

## 2017-03-14 ENCOUNTER — Encounter: Payer: Self-pay | Admitting: Family Medicine

## 2017-03-14 ENCOUNTER — Telehealth: Payer: Self-pay

## 2017-03-14 VITALS — BP 82/58 | HR 77 | Temp 98.4°F | Resp 18

## 2017-03-14 DIAGNOSIS — S129XXS Fracture of neck, unspecified, sequela: Secondary | ICD-10-CM

## 2017-03-14 DIAGNOSIS — S14109S Unspecified injury at unspecified level of cervical spinal cord, sequela: Secondary | ICD-10-CM

## 2017-03-14 DIAGNOSIS — R110 Nausea: Secondary | ICD-10-CM | POA: Diagnosis not present

## 2017-03-14 DIAGNOSIS — R634 Abnormal weight loss: Secondary | ICD-10-CM | POA: Diagnosis not present

## 2017-03-14 DIAGNOSIS — Z0000 Encounter for general adult medical examination without abnormal findings: Secondary | ICD-10-CM | POA: Diagnosis not present

## 2017-03-14 DIAGNOSIS — F321 Major depressive disorder, single episode, moderate: Secondary | ICD-10-CM

## 2017-03-14 DIAGNOSIS — S1201XS Stable burst fracture of first cervical vertebra, sequela: Secondary | ICD-10-CM

## 2017-03-14 DIAGNOSIS — G8250 Quadriplegia, unspecified: Secondary | ICD-10-CM

## 2017-03-14 LAB — URINE CULTURE

## 2017-03-14 MED ORDER — ONDANSETRON HCL 4 MG PO TABS: 4.0000 mg | ORAL_TABLET | Freq: Three times a day (TID) | ORAL | 0 refills | Status: DC | PRN

## 2017-03-14 MED ORDER — PAROXETINE HCL 10 MG PO TABS: 10.0000 mg | ORAL_TABLET | Freq: Every day | ORAL | 1 refills | Status: DC

## 2017-03-14 NOTE — Telephone Encounter (Signed)
Please advise 

## 2017-03-14 NOTE — Telephone Encounter (Signed)
Patient's mom Shawna Orleans(Melanie) is calling wanting to know if Dr. Sherrie MustacheFisher would send in a Rx for Ensure or Boost. She reports that it is getting very costly for her to buy. She reports that the patient is down to 85lbs and feels that this could help with her weight. The patient uses [facility] [address] [place] [phone]. Thanks!

## 2017-03-14 NOTE — Progress Notes (Signed)
Patient: Cassandra Lowe, Female    DOB: 1990-09-02, 26 y.o.   MRN: [id number] Visit Date: 03/14/2017  Today's Provider: Mila Merry, MD   Chief Complaint  Patient presents with  . Annual Exam   Subjective:   Patient saw [facility]enzie for AWV on 03/12/2017.   Annual physical exam Cassandra Lowe is a 27 y.o. female who presents today for health maintenance and complete physical. She feels fairly well. She reports exercising twice a week. She reports she is sleeping poorly.  ----------------------------------------------------------------- Quadriplegia, post-traumatic (HCC) From 06/07/2015-Continue to benefit for physical therapy and care services. Patient would like to discuss ways to gain weight.   She also states she has been more depressed and apparently had friend who passed away recently which has been upsetting. Is not sleeping well and has poor appetite as above.   Wt Readings from Last 3 Encounters:  03/12/17 87 lb (39.5 kg)  07/24/15 100 lb (45.4 kg)  06/23/14 100 lb (45.4 kg)      Review of Systems  Constitutional: Positive for unexpected weight change. Negative for chills, fatigue and fever.  HENT: Negative for congestion, ear pain, rhinorrhea, sneezing and sore throat.   Eyes: Negative.  Negative for pain and redness.  Respiratory: Negative for cough, shortness of breath and wheezing.   Cardiovascular: Negative for chest pain and leg swelling.  Gastrointestinal: Negative for abdominal pain, blood in stool, constipation, diarrhea and nausea.  Endocrine: Negative for polydipsia and polyphagia.  Genitourinary: Negative.  Negative for dysuria, flank pain, hematuria, pelvic pain, vaginal bleeding and vaginal discharge.  Musculoskeletal: Positive for arthralgias (in shoulders), back pain and neck pain. Negative for gait problem and joint swelling.  Skin: Negative for rash.  Neurological: Negative.  Negative for dizziness, tremors, seizures, weakness,  light-headedness, numbness and headaches.  Hematological: Negative for adenopathy.  Psychiatric/Behavioral: Negative.  Negative for behavioral problems, confusion and dysphoric mood. The patient is not nervous/anxious and is not hyperactive.     Social History      She  reports that  has never smoked. she has never used smokeless tobacco. She reports that she does not drink alcohol or use drugs.       Social History   Socioeconomic History  . Marital status: Single    Spouse name: None  . Number of children: 0  . Years of education: None  . Highest education level: Bachelor's degree (e.g., BA, AB, BS)  Social Needs  . Financial resource strain: Somewhat hard  . Food insecurity - worry: Never true  . Food insecurity - inability: Never true  . Transportation needs - medical: No  . Transportation needs - non-medical: No  Occupational History  . Occupation: disabled  Tobacco Use  . Smoking status: Never Smoker  . Smokeless tobacco: Never Used  Substance and Sexual Activity  . Alcohol use: No    Alcohol/week: 0.0 oz  . Drug use: No  . Sexual activity: None    Comment: sexually active at 27 years of age; has had one sexual partner. Used condoms  Other Topics Concern  . None  Social History Narrative  . None    Past Medical History:  Diagnosis Date  . Burst fracture of cervical vertebra (HCC)   . History of chicken pox   . Quadriplegia [facility])      Patient Active Problem List   Diagnosis Date Noted  . Neurogenic bladder 09/08/2016  . Paronychia of great toe, right 08/02/2016  . Bipolar  affective disorder, current episode manic without psychotic symptoms (HCC) 07/27/2015  . Fracture cervical vertebra-closed (HCC) 05/20/2015  . Lack of concentration 05/20/2015  . Quadriplegia (HCC) 05/20/2015  . 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 Chronic respiratory insufficiency 03/10/2014  . Spinal cord injury, cervical region (HCC) 02/15/2014  . Burst fracture of cervical vertebra  (HCC) 02/11/2014    Past Surgical History:  Procedure Laterality Date  . C4-C6 cervical fusion with Decompression or C5 burst fracture  02/12/2014  . MOUTH SURGERY     one tooth removed  . Reversal of Tracheostomy  03/22/2014  . TRACHEOSTOMY  02/26/2014   pallative tracheostomy for respiratory failure secondary to C-spine fracture and paraplegia    Family History        Family Status  Relation Name Status  . Mother  Alive  . Father  Alive  . Sister  Alive  . Brother  Alive  . Other Grandfather Alive  . Other Grandmother Alive        Her family history includes Bipolar disorder in her brother; Diabetes in her mother and other; Dysmenorrhea in her mother; Hypertension in her other; Ovarian cysts in her mother; Schizophrenia in her brother.     Allergies  Allergen Reactions  . Morphine And Related Nausea Only  . Oxycodone Itching  . Tape     Other reaction(s): Other (See Comments) "Skin burns" Skin breakdown  . Latex     Other reaction(s): Other (See Comments) "Skin burns" Skin breakdown Other reaction(s): Other (See Comments) blisters  . Tramadol Itching    Patient can take if she takes a benadryl with it. Patient can take if she takes a benadryl with it.      Current Outpatient Medications:  .  amoxicillin-clavulanate (AUGMENTIN) 875-125 MG tablet, Take 1 tablet by mouth 2 (two) times daily. (Patient taking differently: Take 1 tablet by mouth daily. ), Disp: 14 tablet, Rfl: 0 .  baclofen (LIORESAL) 10 MG tablet, Take 5 mg by mouth at bedtime., Disp: , Rfl:  .  bisacodyl (MAGIC BULLETS) 10 MG suppository, Place 10 mg rectally daily as needed for mild constipation or moderate constipation. , Disp: , Rfl:  .  Ginger, Zingiber officinalis, (GINGER PO), Take by mouth as needed., Disp: , Rfl:  .  midodrine (PROAMATINE) 5 MG tablet, TAKE 2 TABLETS(10 MG) BY MOUTH THREE TIMES DAILY AS NEEDED, Disp: 180 tablet, Rfl: 4 .  oxybutynin (DITROPAN XL) 15 MG 24 hr tablet, Take 15 mg  by mouth 2 (two) times daily., Disp: , Rfl:    Patient Care Team: Malva Limes, MD as PCP - General (Family Medicine)      Objective:   Vitals: BP (!) 82/58 (BP Location: Right Arm, Patient Position: Sitting, Cuff Size: Normal)   Pulse 77   Temp 98.4 F (36.9 C) (Oral)   Resp 18   SpO2 96% Comment: room air  There were no vitals filed for this visit.   Physical Exam   General Appearance:    Alert, cooperative, no distress, appears stated age  Head:    Normocephalic, without obvious abnormality, atraumatic  Eyes:    PERRL, conjunctiva/corneas clear, EOM's intact, fundi    benign, both eyes  Ears:    Normal TM's and external ear canals, both ears  Nose:   Nares normal, septum midline, mucosa normal, no drainage    or sinus tenderness  Throat:   Lips, mucosa, and tongue normal; teeth and gums normal  Neck:   Supple, symmetrical, trachea  midline, no adenopathy;    thyroid:  no enlargement/tenderness/nodules; no carotid   bruit or JVD  Back:     Symmetric, no curvature, ROM normal, no CVA tenderness  Lungs:     Clear to auscultation bilaterally, respirations unlabored  Chest Wall:    No tenderness or deformity   Heart:    Regular rate and rhythm, S1 and S2 normal, no murmur, rub   or gallop  Breast Exam:    deferred  Abdomen:     Soft, non-tender, bowel sounds active all four quadrants,    no masses, no organomegaly  Pelvic:    deferred  Extremities:   Hypertonic, MS 0/5 LEs, 3/5 UEs  Pulses:   2+ and symmetric all extremities  Skin:   Skin color, texture, turgor normal, no rashes or lesions  Lymph nodes:   Cervical, supraclavicular, and axillary nodes normal  Neurologic:   CNII-XII intact, normal strength, sensation and reflexes    throughout    Depression Screen PHQ 2/9 Scores 03/12/2017  PHQ - 2 Score 3  PHQ- 9 Score 13      Assessment & Plan:     Routine Health Maintenance and Physical Exam  Exercise Activities and Dietary recommendations Goals    . DIET  - INCREASE WATER INTAKE     Recommend increasing water intake to 4-6 glasses a day.        Immunization History  Administered Date(s) Administered  . HPV Quadrivalent 10/20/2009, 01/18/2010, 05/25/2010  . Hepatitis A 10/20/2009, 05/25/2010  . Meningococcal Conjugate 10/20/2009  . Tdap 10/20/2009    Health Maintenance  Topic Date Due  . HIV Screening  05/26/2007  . INFLUENZA VACCINE  05/06/2017 (Originally 09/06/2016)  . PAP SMEAR  06/09/2019  . TETANUS/TDAP  10/21/2019     Discussed health benefits of physical activity, and encouraged her to engage in regular exercise appropriate for her age and condition.    --------------------------------------------------------------------  1. Annual physical exam Up to date on HM. [facility]yn[facility]bre[facility]pel[facility]  2. Quadriplegia (HCC) Stable, functional   3. Injury of cervical spinal cord, sequela (HCC)   4. Closed burst fracture of cervical vertebra, sequela   5. Depression, major, single episode, moderate (HCC) start - PARoxetine (PAXIL) 10 MG tablet; Take 1 tablet (10 mg total) by mouth daily.  Dispense: 90 tablet; Refill: 1  6. Nausea  - ondansetron (ZOFRAN) 4 MG tablet; Take 1 tablet (4 mg total) by mouth every 8 (eight) hours as needed for nausea or vomiting.  Dispense: 20 tablet; Refill: 0  7. Weight loss Likely related to chronic nausea and poor appetite. Advised treating depression will likely improve her appetite.  - ondansetron (ZOFRAN) 4 MG tablet; Take 1 tablet (4 mg total) by mouth every 8 (eight) hours as needed for nausea or vomiting.  Dispense: 20 tablet; Refill: 0  Follow up one month for medications and weight check.      Mila Merryonald Keston Seever, MD  [facility]

## 2017-03-19 DIAGNOSIS — R339 Retention of urine, unspecified: Secondary | ICD-10-CM | POA: Diagnosis not present

## 2017-03-24 DIAGNOSIS — R110 Nausea: Secondary | ICD-10-CM | POA: Insufficient documentation

## 2017-03-24 DIAGNOSIS — R1312 Dysphagia, oropharyngeal phase: Secondary | ICD-10-CM | POA: Insufficient documentation

## 2017-03-28 DIAGNOSIS — Z7982 Long term (current) use of aspirin: Secondary | ICD-10-CM | POA: Diagnosis not present

## 2017-03-28 DIAGNOSIS — Z885 Allergy status to narcotic agent status: Secondary | ICD-10-CM | POA: Diagnosis not present

## 2017-03-28 DIAGNOSIS — S14106S Unspecified injury at C6 level of cervical spinal cord, sequela: Secondary | ICD-10-CM | POA: Diagnosis not present

## 2017-03-28 DIAGNOSIS — R350 Frequency of micturition: Secondary | ICD-10-CM | POA: Diagnosis not present

## 2017-03-28 DIAGNOSIS — Z87442 Personal history of urinary calculi: Secondary | ICD-10-CM | POA: Diagnosis not present

## 2017-03-28 DIAGNOSIS — R197 Diarrhea, unspecified: Secondary | ICD-10-CM | POA: Diagnosis not present

## 2017-03-28 DIAGNOSIS — N319 Neuromuscular dysfunction of bladder, unspecified: Secondary | ICD-10-CM | POA: Diagnosis not present

## 2017-03-28 DIAGNOSIS — Z993 Dependence on wheelchair: Secondary | ICD-10-CM | POA: Diagnosis not present

## 2017-03-28 DIAGNOSIS — N390 Urinary tract infection, site not specified: Secondary | ICD-10-CM | POA: Diagnosis not present

## 2017-03-29 ENCOUNTER — Ambulatory Visit (INDEPENDENT_AMBULATORY_CARE_PROVIDER_SITE_OTHER): Payer: Medicare Other | Admitting: Family Medicine

## 2017-03-29 ENCOUNTER — Encounter: Payer: Self-pay | Admitting: Family Medicine

## 2017-03-29 VITALS — BP 90/58 | HR 78 | Temp 98.1°F | Resp 16

## 2017-03-29 DIAGNOSIS — H109 Unspecified conjunctivitis: Secondary | ICD-10-CM

## 2017-03-29 MED ORDER — CIPROFLOXACIN HCL 0.3 % OP SOLN: 1.0000 [drp] | OPHTHALMIC | 0 refills | Status: AC

## 2017-03-29 NOTE — Progress Notes (Signed)
Patient: Cassandra Lowe Female    DOB: 28-Feb-1990   27 y.o.   MRN: [id number] Visit Date: 03/29/2017  Today's Provider: Mila Merry, MD   Chief Complaint  Patient presents with  . Eye Problem   Subjective:    Patient states she has had left eye irritation for over 1 month. Patient has a lump on the left eye lid that irritates her eye. Patient states left eye is itchy, red, swollen, sore. Patient has been using eye drops and warm compresses with only mild relief.     Eye Problem   The left eye is affected. This is a new problem. The current episode started more than 1 month ago (1 month). The problem has been waxing and waning. There was no injury mechanism. The pain is mild. There is no known exposure to pink eye. She does not wear contacts. Associated symptoms include blurred vision, an eye discharge, eye redness, a foreign body sensation, itching and photophobia. Pertinent negatives include no double vision, fever, nausea, recent URI, vomiting or weakness. She has tried eye drops (warm compresses ) for the symptoms. The treatment provided mild relief.       Allergies  Allergen Reactions  . Baclofen     Memory loss  . Morphine And Related Nausea Only  . Oxycodone Itching  . Tape     Other reaction(s): Other (See Comments) "Skin burns" Skin breakdown  . Latex     Other reaction(s): Other (See Comments) "Skin burns" Skin breakdown Other reaction(s): Other (See Comments) blisters  . Tramadol Itching    Patient can take if she takes a benadryl with it. Patient can take if she takes a benadryl with it.      Current Outpatient Medications:  .  bisacodyl (MAGIC BULLETS) 10 MG suppository, Place 10 mg rectally daily as needed for mild constipation or moderate constipation. , Disp: , Rfl:  .  Ginger, Zingiber officinalis, (GINGER PO), Take by mouth as needed., Disp: , Rfl:  .  midodrine (PROAMATINE) 5 MG tablet, TAKE 2 TABLETS(10 MG) BY MOUTH THREE TIMES DAILY AS  NEEDED, Disp: 180 tablet, Rfl: 4 .  ondansetron (ZOFRAN) 4 MG tablet, Take 1 tablet (4 mg total) by mouth every 8 (eight) hours as needed for nausea or vomiting., Disp: 20 tablet, Rfl: 0 .  oxybutynin (DITROPAN XL) 15 MG 24 hr tablet, Take 15 mg by mouth 2 (two) times daily., Disp: , Rfl:  .  PARoxetine (PAXIL) 10 MG tablet, Take 1 tablet (10 mg total) by mouth daily., Disp: 90 tablet, Rfl: 1  Review of Systems  Constitutional: Negative for appetite change, chills, fatigue and fever.  Eyes: Positive for blurred vision, photophobia, discharge, redness and itching. Negative for double vision.  Respiratory: Negative for chest tightness and shortness of breath.   Cardiovascular: Negative for chest pain and palpitations.  Gastrointestinal: Negative for abdominal pain, nausea and vomiting.  Neurological: Negative for dizziness and weakness.    Social History   Tobacco Use  . Smoking status: Never Smoker  . Smokeless tobacco: Never Used  Substance Use Topics  . Alcohol use: No    Alcohol/week: 0.0 oz   Objective:   BP (!) 90/58 (BP Location: Right Arm, Patient Position: Sitting, Cuff Size: Normal)   Pulse 78   Temp 98.1 F (36.7 C) (Oral)   Resp 16   SpO2 96%  Vitals:   03/29/17 1110  BP: (!) 90/58  Pulse: 78  Resp: 16  Temp: 98.1 F (36.7 C)  TempSrc: Oral  SpO2: 96%     Physical Exam  General Appearance:    Alert, cooperative, no distress  HENT:   ENT exam normal, no neck nodes or sinus tenderness  Eyes:    PERRL, left upper eyelid mildly injected with vague cystic mass   Lungs:     Clear to auscultation bilaterally, respirations unlabored  Heart:    Regular rate and rhythm  Neurologic:   Awake, alert, oriented x 3. No apparent focal neurological           defect.            Assessment & Plan:     1. Conjunctivitis of left eye, unspecified conjunctivitis type Call for ophthalmology referral if not greatly improved in a week.  - ciprofloxacin (CILOXAN) 0.3 %  ophthalmic solution; Place 1 drop into both eyes every 4 (four) hours while awake for 7 days.  Dispense: 5 mL; Refill: 0       Mila Merryonald Fisher, MD  [facility]

## 2017-04-04 ENCOUNTER — Telehealth: Payer: Self-pay | Admitting: Family Medicine

## 2017-04-04 NOTE — Telephone Encounter (Signed)
Patient called and wanted Dr Sherrie MustacheFisher to know that she has a appt with [facility] Sherrine [facility]Glenn [facility] for this Friday and that he will be sending something, she also stated that her eyes was getting worst

## 2017-04-09 DIAGNOSIS — H0014 Chalazion left upper eyelid: Secondary | ICD-10-CM | POA: Diagnosis not present

## 2017-04-11 DIAGNOSIS — M62838 Other muscle spasm: Secondary | ICD-10-CM | POA: Diagnosis not present

## 2017-04-11 DIAGNOSIS — R110 Nausea: Secondary | ICD-10-CM | POA: Diagnosis not present

## 2017-04-11 DIAGNOSIS — S14105S Unspecified injury at C5 level of cervical spinal cord, sequela: Secondary | ICD-10-CM | POA: Diagnosis not present

## 2017-04-11 DIAGNOSIS — N319 Neuromuscular dysfunction of bladder, unspecified: Secondary | ICD-10-CM | POA: Diagnosis not present

## 2017-04-11 DIAGNOSIS — S14109S Unspecified injury at unspecified level of cervical spinal cord, sequela: Secondary | ICD-10-CM | POA: Diagnosis not present

## 2017-04-11 DIAGNOSIS — G8250 Quadriplegia, unspecified: Secondary | ICD-10-CM | POA: Diagnosis not present

## 2017-04-23 DIAGNOSIS — H0014 Chalazion left upper eyelid: Secondary | ICD-10-CM | POA: Diagnosis not present

## 2017-04-25 DIAGNOSIS — G8250 Quadriplegia, unspecified: Secondary | ICD-10-CM | POA: Diagnosis not present

## 2017-04-25 DIAGNOSIS — K592 Neurogenic bowel, not elsewhere classified: Secondary | ICD-10-CM | POA: Diagnosis not present

## 2017-04-25 DIAGNOSIS — B379 Candidiasis, unspecified: Secondary | ICD-10-CM | POA: Diagnosis not present

## 2017-04-25 DIAGNOSIS — G904 Autonomic dysreflexia: Secondary | ICD-10-CM | POA: Diagnosis not present

## 2017-04-25 DIAGNOSIS — N319 Neuromuscular dysfunction of bladder, unspecified: Secondary | ICD-10-CM | POA: Diagnosis not present

## 2017-04-25 DIAGNOSIS — Z885 Allergy status to narcotic agent status: Secondary | ICD-10-CM | POA: Diagnosis not present

## 2017-04-25 DIAGNOSIS — Z888 Allergy status to other drugs, medicaments and biological substances status: Secondary | ICD-10-CM | POA: Diagnosis not present

## 2017-04-25 DIAGNOSIS — Z79899 Other long term (current) drug therapy: Secondary | ICD-10-CM | POA: Diagnosis not present

## 2017-04-25 DIAGNOSIS — Z91040 Latex allergy status: Secondary | ICD-10-CM | POA: Diagnosis not present

## 2017-04-26 ENCOUNTER — Telehealth: Payer: Self-pay | Admitting: Family Medicine

## 2017-04-26 NOTE — Telephone Encounter (Signed)
Please advise 

## 2017-04-26 NOTE — Telephone Encounter (Signed)
Called patient to follow up on Community Resource Ref[facility] and [facility].  Nutritionist is recommending Premier Low sugar high nutrient Shake and said it is covered by her insurance.  She would like MD order for product so that insurance will cover.

## 2017-04-27 NOTE — Telephone Encounter (Signed)
That's fine

## 2017-04-28 DIAGNOSIS — R339 Retention of urine, unspecified: Secondary | ICD-10-CM | POA: Diagnosis not present

## 2017-04-30 ENCOUNTER — Other Ambulatory Visit: Payer: Self-pay | Admitting: *Deleted

## 2017-04-30 MED ORDER — PREMIER PROTEIN SHAKE: 11.0000 [oz_av] | Freq: Three times a day (TID) | ORAL | 12 refills | Status: DC
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
## 2017-04-30 NOTE — Telephone Encounter (Signed)
Rx sent to pharmacy. Patient was notified.  

## 2017-05-01 DIAGNOSIS — S14109S Unspecified injury at unspecified level of cervical spinal cord, sequela: Secondary | ICD-10-CM | POA: Diagnosis not present

## 2017-05-01 DIAGNOSIS — G8250 Quadriplegia, unspecified: Secondary | ICD-10-CM | POA: Diagnosis not present

## 2017-05-01 DIAGNOSIS — N319 Neuromuscular dysfunction of bladder, unspecified: Secondary | ICD-10-CM | POA: Diagnosis not present

## 2017-05-01 DIAGNOSIS — N3000 Acute cystitis without hematuria: Secondary | ICD-10-CM | POA: Diagnosis not present

## 2017-05-01 MED ORDER — PREMIER PROTEIN SHAKE: 11.0000 [oz_av] | Freq: Three times a day (TID) | ORAL | 12 refills | Status: DC

## 2017-05-01 NOTE — Addendum Note (Signed)
Addended by: Marlene LardMILLER, Fortune Torosian M on: 05/01/2017 04:17 PM   Modules accepted: Orders

## 2017-05-02 ENCOUNTER — Other Ambulatory Visit: Payer: Self-pay | Admitting: *Deleted

## 2017-05-02 NOTE — Patient Outreach (Signed)
[facility] Care Management  05/02/2017  Cassandra Lowe Mar 26, 1990 [id number]   CSW made an initial attempt to try and contact patient today to perform phone assessment, as well as assess and assist with social needs and services, without success.  A HIPAA compliant message was left for patient on voicemail.  CSW is currently awaiting a return call. CSW will make a second outreach on Thursday, May 03, 2017, if CSW does not receive a return call from patient in the meantime. Danford BadJoanna Saporito, BSW, MSW, LCSW  Licensed Restaurant manager, fast foodClinical Social Worker  [facility] Care Management [name]  Mailing [place], [place], [place] [address] Physical Address-[address], [place], [place] [address] Toll Free Main # [phone] Fax # [phone] Cell # [phone]  Office # [phone] [email or web address]

## 2017-05-03 ENCOUNTER — Other Ambulatory Visit: Payer: Self-pay | Admitting: *Deleted

## 2017-05-03 ENCOUNTER — Encounter: Payer: Self-pay | Admitting: *Deleted

## 2017-05-03 NOTE — Patient Outreach (Signed)
[facility] Care Management  05/03/2017  Cassandra Lowe 1990-07-27 [id number]   CSW made a second attempt to try and contact patient today to perform phone assessment, as well as assess and assist with social work needs and services, without success.  A HIPAA compliant message was left for patient on voicemail.  CSW continues to await a return call.  CSW will mail an outreach letter to patient's home, encouraging patient to contact CSW at their earliest convenience, if patient is interested in receiving social work services through CSW with [facility].  If CSW does not receive a return call from patient within the next 10 business days, CSW will make one last call attempt before proceeding with case closure.  Required number of phone attempts will have been made and outreach letter mailed.  Danford BadJoanna Saporito, BSW, MSW, LCSW  Licensed Restaurant manager, fast foodClinical Social Worker  [facility] Care Management [place] [facility]  Mailing [place], [place], [place] [address] Physical Address-[address], [place], [place] [address] Toll Free Main # [phone] Fax # [phone] Cell # [phone]  Office # [phone] [email or web address]

## 2017-05-04 ENCOUNTER — Telehealth: Payer: Self-pay | Admitting: Family Medicine

## 2017-05-04 NOTE — Telephone Encounter (Addendum)
Pending approval.

## 2017-05-04 NOTE — Telephone Encounter (Signed)
[employer] needs a prior authorization for [employer] Sh[employer] (protein shake) for the patient.  States there isn't a fax to send over they just need our office to contact the provider line and give them details or DX for the reason why the pt needs the shake.  States there is no ref number either.   Provider Line [phone]

## 2017-05-09 ENCOUNTER — Encounter: Payer: Self-pay | Admitting: *Deleted

## 2017-05-11 NOTE — Telephone Encounter (Signed)
Marcelino DusterMichelle, any word on this PA? Just trying to clear out some messages. Thanks Northwest AirlinesElena

## 2017-05-15 NOTE — Telephone Encounter (Signed)
Patient's insurance denied PA for otc protein shake.

## 2017-05-17 ENCOUNTER — Encounter: Payer: Self-pay | Admitting: *Deleted

## 2017-05-17 ENCOUNTER — Other Ambulatory Visit: Payer: Self-pay | Admitting: *Deleted

## 2017-05-17 NOTE — Patient Outreach (Signed)
[facility] Care Management  05/17/2017  Cassandra GaulGabrielle L Lowe 10-27-1990 [id number]   CSW made a third and final attempt to try and contact patient today to perform phone assessment, as well as assess and assist with social work needs and services, without success.  A HIPAA compliant message was left for patient on voicemail.  CSW will proceed with case closure, as required number of phone attempts have been made and an outreach letter was mailed to patient's home allowing 10 business days for a response.   Danford BadJoanna Chardai Gangemi, BSW, MSW, LCSW  Licensed Restaurant manager, fast foodClinical Social Worker  [facility] Care Management [place] [facility]  Mailing [place], [place], [place] [address] Physical Address-[address], [place], [place] [address] Toll Free Main # [phone] Fax # [phone] Cell # [phone]  Office # [phone] Mardene [email or web address]

## 2017-05-21 ENCOUNTER — Telehealth: Payer: Self-pay | Admitting: Family Medicine

## 2017-05-21 NOTE — Telephone Encounter (Signed)
Pt called back stated that the fax# and phone# in the message below are incorrect. These are the correct numbers. Fax# [phone] Phone# [phone] Ext [phone] Thanks TNP

## 2017-05-21 NOTE — Telephone Encounter (Signed)
Please advise 

## 2017-05-21 NOTE — Telephone Encounter (Signed)
Patient states that her insurance does not cover Premier protein.  She states that her insurance does cover Ensure and she has found Ensure Maximum protein that she was able to tolerate and would like a prescription for this.  She uses [facility] in [place].  She also needs to change the size of her adult diapers at [facility] due to weight loss.  She states that she needs you to fax them a note stating that she does need to go down in size on her incontinence supplies due to weight loss.  The fax number is [phone].  The phone number for [facility] is [phone].

## 2017-05-23 ENCOUNTER — Other Ambulatory Visit: Payer: Self-pay | Admitting: Family Medicine

## 2017-05-23 MED ORDER — BISACODYL 10 MG RE SUPP: 10.0000 mg | Freq: Every day | RECTAL | 4 refills | Status: DC | PRN

## 2017-05-23 NOTE — Telephone Encounter (Signed)
Patient is requesting a refill on the following medication.  She states that she has not had a bowel movement in a few days and needs this done ASAP so she does not have to go to the ER.  bisacodyl (MAGIC BULLETS) 10 MG suppository   [facility] would [facility].

## 2017-05-27 ENCOUNTER — Encounter (HOSPITAL_COMMUNITY): Payer: Self-pay

## 2017-05-27 ENCOUNTER — Emergency Department (HOSPITAL_COMMUNITY): Payer: Medicare Other

## 2017-05-27 ENCOUNTER — Emergency Department (HOSPITAL_COMMUNITY)
Admission: EM | Admit: 2017-05-27 | Discharge: 2017-05-27 | Disposition: A | Discharge status: Home / Self Care | Payer: Medicare Other | Attending: Emergency Medicine | Admitting: Emergency Medicine

## 2017-05-27 DIAGNOSIS — G8250 Quadriplegia, unspecified: Secondary | ICD-10-CM | POA: Insufficient documentation

## 2017-05-27 DIAGNOSIS — Z3202 Encounter for pregnancy test, result negative: Secondary | ICD-10-CM | POA: Diagnosis not present

## 2017-05-27 DIAGNOSIS — Z79899 Other long term (current) drug therapy: Secondary | ICD-10-CM | POA: Diagnosis not present

## 2017-05-27 DIAGNOSIS — N319 Neuromuscular dysfunction of bladder, unspecified: Secondary | ICD-10-CM | POA: Diagnosis not present

## 2017-05-27 DIAGNOSIS — Z91040 Latex allergy status: Secondary | ICD-10-CM | POA: Diagnosis not present

## 2017-05-27 DIAGNOSIS — R4182 Altered mental status, unspecified: Secondary | ICD-10-CM | POA: Diagnosis present

## 2017-05-27 DIAGNOSIS — F3112 Bipolar disorder, current episode manic without psychotic features, moderate: Secondary | ICD-10-CM | POA: Diagnosis not present

## 2017-05-27 LAB — URINALYSIS, ROUTINE W REFLEX MICROSCOPIC
BACTERIA UA: NONE SEEN
Bilirubin Urine: NEGATIVE
Glucose, UA: NEGATIVE mg/dL
Hgb urine dipstick: NEGATIVE
Ketones, ur: 80 mg/dL — AB
Leukocytes, UA: NEGATIVE
Nitrite: NEGATIVE
PROTEIN: 30 mg/dL — AB
SPECIFIC GRAVITY, URINE: 1.031 — AB (ref 1.005–1.030)
pH: 6 (ref 5.0–8.0)

## 2017-05-27 LAB — POC URINE PREG, ED: PREG TEST UR: NEGATIVE

## 2017-05-27 MED ORDER — SODIUM CHLORIDE 0.9 % IV BOLUS: 500.0000 mL | Freq: Once | INTRAVENOUS | Status: DC

## 2017-05-27 MED ORDER — HYDROXYZINE HCL 25 MG PO TABS
25.0000 mg | ORAL_TABLET | Freq: Once | ORAL | Status: DC
  Filled 2017-05-27: qty 1

## 2017-05-27 NOTE — ED Provider Notes (Signed)
[facility]-EMERGENCY DEPT Provider Note   CSN: [id number] Arrival date & time: 05/27/17  0012     History   Chief Complaint Chief Complaint  Patient presents with  . Altered Mental Status  . Recurrent UTI    HPI Cassandra Lowe is a 27 y.o. female.  Patient with past medical history remarkable for quadriplegia and neurogenic bladder, and bipolar disorder presents to the emergency department with a chief complaint of agitation.  She is accompanied by significant other, who states that the patient has been acting manic for the past 48 hours.  He states that she is aggressive, and at times combative and hostile.  He states that this is not normal for the patient, but that she has been like this in the past and has required psychiatric admission.  He reports that she does have a history of recurrent urinary tract infections, and is concerned that she may have one today.  I am unable to obtain any useful history from the patient due to her agitated state.  Level 5 caveat applies secondary to agitation.  The history is provided by the patient and a significant other. No language interpreter was used.    Past Medical History:  Diagnosis Date  . Burst fracture of cervical vertebra (HCC)   . History of chicken pox   . Quadriplegia [facility](HCC)     Patient Active Problem List   Diagnosis Date Noted  . Oropharyngeal dysphagia 03/24/2017  . Nausea 03/24/2017  . Neurogenic bladder 09/08/2016  . Paronychia of great toe, right 08/02/2016  . Bipolar affective disorder, current episode manic without psychotic symptoms (HCC) 07/27/2015  . Fracture cervical vertebra-closed (HCC) 05/20/2015  . Lack of concentration 05/20/2015  . Quadriplegia (HCC) 05/20/2015  . 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 Chronic respiratory insufficiency 03/10/2014  . Spinal cord injury, cervical region (HCC) 02/15/2014  . Burst fracture of cervical vertebra (HCC) 02/11/2014    Past Surgical  History:  Procedure Laterality Date  . C4-C6 cervical fusion with Decompression or C5 burst fracture  02/12/2014  . MOUTH SURGERY     one tooth removed  . Reversal of Tracheostomy  03/22/2014  . TRACHEOSTOMY  02/26/2014   pallative tracheostomy for respiratory failure secondary to C-spine fracture and paraplegia     OB History   None      Home Medications    Prior to Admission medications   Medication Sig Start Date End Date Taking? Authorizing Provider  bisacodyl (MAGIC BULLETS) 10 MG suppository Place 1 suppository (10 mg total) rectally daily as needed for mild constipation or moderate constipation. 05/23/17   Malva LimesFisher, Donald E, MD  Ginger, Zingiber officinalis, (GINGER PO) Take by mouth as needed.    [provider]  midodrine (PROAMATINE) 5 MG tablet TAKE 2 TABLETS(10 MG) BY MOUTH THREE TIMES DAILY AS NEEDED 12/25/16   Malva LimesFisher, Donald E, MD  ondansetron (ZOFRAN) 4 MG tablet Take 1 tablet (4 mg total) by mouth every 8 (eight) hours as needed for nausea or vomiting. 03/14/17   Malva LimesFisher, Donald E, MD  oxybutynin (DITROPAN XL) 15 MG 24 hr tablet Take 15 mg by mouth 2 (two) times daily.    [provider]  PARoxetine (PAXIL) 10 MG tablet Take 1 tablet (10 mg total) by mouth daily. 03/14/17   Malva LimesFisher, Donald E, MD  protein supplement shake (PREMIER PROTEIN) LIQD Take 325 mLs (11 oz total) by mouth 3 (three) times daily between meals. 05/01/17   Malva LimesFisher, Donald E, MD  Family History Family History  Problem Relation Age of Onset  . Ovarian cysts Mother   . Dysmenorrhea Mother   . Diabetes Mother        type 2; having complications  . Hypertension Mother   . Bipolar disorder Brother   . Schizophrenia Brother   . Hypertension Other   . Diabetes Other     Social History Social History   Tobacco Use  . Smoking status: Never Smoker  . Smokeless tobacco: Never Used  Substance Use Topics  . Alcohol use: No    Alcohol/week: 0.0 oz  . Drug use: No     Allergies     Baclofen; Morphine and related; Oxycodone; Tape; Latex; and Tramadol   Review of Systems Review of Systems  Unable to perform ROS: Mental status change     Physical Exam Updated Vital Signs BP 110/70 (BP Location: Left Arm)   Pulse 96   Temp 98.1 F (36.7 C) (Oral)   Resp 15   Ht 5' 4.25" (1.632 m)   Wt 39.9 kg (88 lb)   LMP 05/09/2017   SpO2 98%   BMI 14.99 kg/m   Physical Exam  Constitutional: She is oriented to person, place, and time. She appears well-developed and well-nourished.  HENT:  Head: Normocephalic and atraumatic.  Eyes: Pupils are equal, round, and reactive to light. Conjunctivae and EOM are normal.  Neck: Normal range of motion. Neck supple.  Cardiovascular: Normal rate and regular rhythm. Exam reveals no gallop and no friction rub.  No murmur heard. Pulmonary/Chest: Effort normal and breath sounds normal. No respiratory distress. She has no wheezes. She has no rales. She exhibits no tenderness.  Clear to auscultation bilaterally  Abdominal: Soft. Bowel sounds are normal. She exhibits no distension and no mass. There is no tenderness. There is no rebound and no guarding.  Musculoskeletal: She exhibits no edema or tenderness.  Minimal movement of right upper extremity, unable to move lower extremities Baseline secondary to quadriplegia  Neurological: She is alert and oriented to person, place, and time.  Skin: Skin is warm and dry.  No rashes  Psychiatric:  Agitated and hostile  Nursing note and vitals reviewed.    ED Treatments / Results  Labs (all labs ordered are listed, but only abnormal results are displayed) Labs Reviewed  URINALYSIS, ROUTINE W REFLEX MICROSCOPIC - Abnormal; Notable for the following components:      Result Value   Specific Gravity, Urine 1.031 (*)    Ketones, ur 80 (*)    Protein, ur 30 (*)    Squamous Epithelial / LPF 0-5 (*)    All other components within normal limits  URINE CULTURE  CBC WITH DIFFERENTIAL/PLATELET   COMPREHENSIVE METABOLIC PANEL  POC URINE PREG, ED    EKG None  Radiology No results found.  Procedures Procedures (including critical care time)  Medications Ordered in ED Medications  hydrOXYzine (ATARAX/VISTARIL) tablet 25 mg (has no administration in time range)  sodium chloride 0.9 % bolus 500 mL (has no administration in time range)     Initial Impression / Assessment and Plan / ED Course  I have reviewed the triage vital signs and the nursing notes.  Pertinent labs & imaging results that were available during my care of the patient were reviewed by me and considered in my medical decision making (see chart for details).     Urinalysis is negative.  Patient not pregnant.  Vital signs are stable.  Patient does seem to be manic.  She denies any SI or HI, and significant other denies that she has made any threats as well.  He states that he does not want her to be involuntarily committed, and that he would like for her to have outpatient psychiatric follow-up.  I offered to have patient seen by the psychiatric team in the morning, but he is fearful that she will end up being committed and placed in an inpatient facility.  He does not want this to happen.  He states that he would want rather have her discharged into his care and follow-up on an outpatient basis.  Patient is uncooperative here, and refuses blood work.  She is voluntary at this time, and as such has right to refuse care.  I do not find her to be a danger to herself or to others.  Therefore, I do not feel that initiating IVC paperwork at this time is indicated and that the patient can be discharged home.  She has responsible family members at home.  Neysa Bonito states that he will bring her back if he encounters any further issues.  Final Clinical Impressions(s) / ED Diagnoses   Final diagnoses:  Bipolar 1 disorder with moderate mania [facility])    ED Discharge Orders    None       Roxy Horseman, PA-C 05/27/17  0404    Ward, Layla Maw, DO 05/27/17 [phone]

## 2017-05-27 NOTE — ED Notes (Signed)
Pt given discharge instruction tried to explain them to pt but she continues to talk over nurse. And talk down to nurse and call nurse names.

## 2017-05-27 NOTE — ED Triage Notes (Signed)
Pt has a history of recurrent UTI. Currently taking antibiotics. Pts family reports mental status change, does report sweating, stomach pain, and headaches. Pt denies any pain at this time, states she just wants to go home. Pt concerned about being pregnant.

## 2017-05-27 NOTE — ED Notes (Signed)
Pt still has flight of ideas talking fast with multiple complaints.

## 2017-05-27 NOTE — ED Notes (Signed)
Pt refuses to let nurse get her vital signs, when nurse goes into room pt upset and talking fast and flight of ideas. No respiratory or acute distress noted alert and talking call light in reach.

## 2017-05-27 NOTE — ED Notes (Signed)
Pt refuses to let me stick her at this moment informed Rob PA that pt wants to speak with her.

## 2017-05-28 LAB — URINE CULTURE: Culture: NO GROWTH

## 2017-06-07 DIAGNOSIS — S90422A Blister (nonthermal), left great toe, initial encounter: Secondary | ICD-10-CM | POA: Diagnosis not present

## 2017-06-07 DIAGNOSIS — S90425A Blister (nonthermal), left lesser toe(s), initial encounter: Secondary | ICD-10-CM | POA: Diagnosis not present

## 2017-06-08 ENCOUNTER — Other Ambulatory Visit: Payer: Self-pay

## 2017-06-08 DIAGNOSIS — R8290 Unspecified abnormal findings in urine: Secondary | ICD-10-CM

## 2017-06-08 NOTE — Progress Notes (Signed)
Patient has had a strong  Urine odor and very cloudy. C and S was obtained.

## 2017-06-09 DIAGNOSIS — R8290 Unspecified abnormal findings in urine: Secondary | ICD-10-CM | POA: Diagnosis not present

## 2017-06-11 ENCOUNTER — Ambulatory Visit (INDEPENDENT_AMBULATORY_CARE_PROVIDER_SITE_OTHER): Payer: Medicare Other | Admitting: Family Medicine

## 2017-06-11 ENCOUNTER — Encounter: Payer: Self-pay | Admitting: Family Medicine

## 2017-06-11 VITALS — BP 90/52 | HR 80 | Temp 97.8°F | Resp 16

## 2017-06-11 DIAGNOSIS — R636 Underweight: Secondary | ICD-10-CM

## 2017-06-11 DIAGNOSIS — R630 Anorexia: Secondary | ICD-10-CM | POA: Diagnosis not present

## 2017-06-11 DIAGNOSIS — N390 Urinary tract infection, site not specified: Secondary | ICD-10-CM | POA: Diagnosis not present

## 2017-06-11 LAB — POCT URINALYSIS DIPSTICK
BILIRUBIN UA: NEGATIVE
Blood, UA: NEGATIVE
GLUCOSE UA: NEGATIVE
KETONES UA: NEGATIVE
Nitrite, UA: POSITIVE
Protein, UA: NEGATIVE
Spec Grav, UA: 1.01 (ref 1.010–1.025)
Urobilinogen, UA: 0.2 E.U./dL
pH, UA: 7.5 (ref 5.0–8.0)

## 2017-06-11 MED ORDER — CYPROHEPTADINE HCL 4 MG PO TABS: 2.0000 mg | ORAL_TABLET | Freq: Four times a day (QID) | ORAL | 5 refills | Status: DC | PRN

## 2017-06-11 NOTE — Progress Notes (Signed)
Patient: Cassandra Lowe Female    DOB: August 30, 1990   27 y.o.   MRN: [id number] Visit Date: 06/11/2017  Today's Provider: Mila Merry, MD   Chief Complaint  Patient presents with  . Nutrition Counseling   Subjective:    HPI Malnourishment: Patient comes in requesting a prescription for Premier shakes. She states she was recently hospitalized at [facility] for Malnourishment with persistent weight loss. No nausea or vomiting, but has chronically poor appetite.   Urine problems: Patient believes she may have a UTI. Her urine has had an odor for the past few days.     Allergies  Allergen Reactions  . Baclofen     Memory loss  . Morphine And Related Nausea Only  . Oxycodone Itching  . Tape     Other reaction(s): Other (See Comments) "Skin burns" Skin breakdown  . Latex     Other reaction(s): Other (See Comments) "Skin burns" Skin breakdown Other reaction(s): Other (See Comments) blisters  . Tramadol Itching    Patient can take if she takes a benadryl with it. Patient can take if she takes a benadryl with it.      Current Outpatient Medications:  .  bisacodyl (MAGIC BULLETS) 10 MG suppository, Place 1 suppository (10 mg total) rectally daily as needed for mild constipation or moderate constipation., Disp: 12 suppository, Rfl: 4 .  Ginger, Zingiber officinalis, (GINGER PO), Take by mouth as needed., Disp: , Rfl:  .  midodrine (PROAMATINE) 5 MG tablet, TAKE 2 TABLETS(10 MG) BY MOUTH THREE TIMES DAILY AS NEEDED, Disp: 180 tablet, Rfl: 4 .  ondansetron (ZOFRAN) 4 MG tablet, Take 1 tablet (4 mg total) by mouth every 8 (eight) hours as needed for nausea or vomiting., Disp: 20 tablet, Rfl: 0 .  oxybutynin (DITROPAN XL) 15 MG 24 hr tablet, Take 15 mg by mouth 2 (two) times daily., Disp: , Rfl:  .  PARoxetine (PAXIL) 10 MG tablet, Take 1 tablet (10 mg total) by mouth daily., Disp: 90 tablet, Rfl: 1 .  protein supplement shake (PREMIER PROTEIN) LIQD, Take 325 mLs (11 oz total)  by mouth 3 (three) times daily between meals., Disp: 48 Can, Rfl: 12  Review of Systems  Constitutional: Positive for appetite change and fever. Negative for chills and fatigue.  Respiratory: Negative for chest tightness and shortness of breath.   Cardiovascular: Negative for chest pain and palpitations.  Gastrointestinal: Negative for abdominal pain, nausea and vomiting.  Neurological: Negative for dizziness and weakness.    Social History   Tobacco Use  . Smoking status: Never Smoker  . Smokeless tobacco: Never Used  Substance Use Topics  . Alcohol use: No    Alcohol/week: 0.0 oz   Objective:   BP (!) 90/52 (BP Location: Right Arm, Patient Position: Sitting, Cuff Size: Normal)   Pulse 80   Temp 97.8 F (36.6 C) (Oral)   Resp 16   SpO2 97% Comment: room air    Physical Exam   General Appearance:    Alert, cooperative, no distress, thin  female  Eyes:    PERRL, conjunctiva/corneas clear, EOM's intact       Lungs:     Clear to auscultation bilaterally, respirations unlabored  Heart:    Regular rate and rhythm  Neurologic:   Awake, alert, oriented x 3. No apparent focal neurological           defect.       Results for orders placed or performed in visit  on 06/11/17  POCT Urinalysis Dipstick  Result Value Ref Range   Color, UA yellow    Clarity, UA clear    Glucose, UA negative    Bilirubin, UA negative    Ketones, UA negative    Spec Grav, UA 1.010 1.010 - 1.025   Blood, UA negative    pH, UA 7.5 5.0 - 8.0   Protein, UA negative    Urobilinogen, UA 0.2 0.2 or 1.0 E.U./dL   Nitrite, UA Positive    Leukocytes, UA Small (1+) (A) Negative   Appearance     Odor          Assessment & Plan:     1. Underweight Order sent to reduce size of her adult diapers. Written order sent for Premier milkshakes.   2. Anorexia She requests prescription to help with appetite. She is very interested in Marinol but advised this is controlled and not first line therapy, will  consider Megestrol. Will first try- cyproheptadine (PERIACTIN) 4 MG tablet; Take 0.5-1 tablets (2-4 mg total) by mouth 4 (four) times daily as needed for allergies.  Dispense: 120 tablet; Refill: 5  3. UTI Send urine for culture to ensure antibiotic sensitivities due to history of recurrent UTIs        Mila Merry, MD  [facility]

## 2017-06-12 DIAGNOSIS — M21372 Foot drop, left foot: Secondary | ICD-10-CM | POA: Diagnosis not present

## 2017-06-12 DIAGNOSIS — S90422D Blister (nonthermal), left great toe, subsequent encounter: Secondary | ICD-10-CM | POA: Diagnosis not present

## 2017-06-12 DIAGNOSIS — S90425D Blister (nonthermal), left lesser toe(s), subsequent encounter: Secondary | ICD-10-CM | POA: Diagnosis not present

## 2017-06-12 DIAGNOSIS — M21371 Foot drop, right foot: Secondary | ICD-10-CM | POA: Diagnosis not present

## 2017-06-14 ENCOUNTER — Telehealth: Payer: Self-pay | Admitting: *Deleted

## 2017-06-14 LAB — URINE CULTURE

## 2017-06-14 LAB — SPECIMEN STATUS REPORT

## 2017-06-14 MED ORDER — CEFDINIR 300 MG PO CAPS: 300.0000 mg | ORAL_CAPSULE | Freq: Two times a day (BID) | ORAL | 0 refills | Status: DC

## 2017-06-14 NOTE — Telephone Encounter (Signed)
Patient was notified of results. Expressed understanding. Rx sent to pharmacy. 

## 2017-06-14 NOTE — Telephone Encounter (Signed)
-----   Message from Malva Limes, MD sent at 06/13/2017  4:49 PM EDT ----- Please start cefdinir  Q12 x 7 days for UTI.

## 2017-06-19 ENCOUNTER — Telehealth: Payer: Self-pay

## 2017-06-19 ENCOUNTER — Telehealth: Payer: Self-pay | Admitting: Family Medicine

## 2017-06-19 NOTE — Telephone Encounter (Signed)
Patient called requesting her urine culture from 06/09/2017 to be faxed to her urologist at [facility]. The fax number is [phone] (Dr. Raoul Pitch).   She feels that her UTI is not better, and would like this sent ASAP. I have printed the results, but I did not fax them. Thanks!

## 2017-06-19 NOTE — Telephone Encounter (Signed)
Re faxed results.

## 2017-06-19 NOTE — Telephone Encounter (Signed)
Marcelino Duster with [facility] called to request pt's urine culture results from 06/09/17 for continuity of care. Results were faxed to FAX# [phone]. Thanks TNP

## 2017-06-20 ENCOUNTER — Telehealth: Payer: Self-pay

## 2017-06-20 NOTE — Telephone Encounter (Signed)
Patient called.

## 2017-06-25 MED ORDER — ALBUTEROL SULFATE HFA 108 (90 BASE) MCG/ACT IN AERS: 2.0000 | INHALATION_SPRAY | Freq: Four times a day (QID) | RESPIRATORY_TRACT | 1 refills | Status: AC | PRN

## 2017-06-25 NOTE — Telephone Encounter (Signed)
Please advise 

## 2017-06-25 NOTE — Telephone Encounter (Signed)
Pt called to check status Rx request for albuterol inhaler. Pt stated she requested it last week. Pt is requesting Rx for albuterol inhaler to be sent to [facility]. Pt stated that Dr. Sherrie Mustache hasn't prescribed this for pt in the past but the hospital has due to her lung condition and the fact that she is quadriplegic. Pt stated that her father's funeral is today and she needs the inhaler today and can not schedule an OV at this time. Please advise. Thanks TNP

## 2017-06-28 DIAGNOSIS — S90422D Blister (nonthermal), left great toe, subsequent encounter: Secondary | ICD-10-CM | POA: Diagnosis not present

## 2017-07-04 DIAGNOSIS — H0012 Chalazion right lower eyelid: Secondary | ICD-10-CM | POA: Diagnosis not present

## 2017-07-10 DIAGNOSIS — R339 Retention of urine, unspecified: Secondary | ICD-10-CM | POA: Diagnosis not present

## 2017-07-12 DIAGNOSIS — S90422S Blister (nonthermal), left great toe, sequela: Secondary | ICD-10-CM | POA: Diagnosis not present

## 2017-07-12 DIAGNOSIS — L97522 Non-pressure chronic ulcer of other part of left foot with fat layer exposed: Secondary | ICD-10-CM | POA: Diagnosis not present

## 2017-07-16 ENCOUNTER — Other Ambulatory Visit: Payer: Self-pay

## 2017-07-16 ENCOUNTER — Telehealth: Payer: Self-pay

## 2017-07-16 DIAGNOSIS — N390 Urinary tract infection, site not specified: Secondary | ICD-10-CM | POA: Diagnosis not present

## 2017-07-16 LAB — POCT URINALYSIS DIPSTICK
Bilirubin, UA: NEGATIVE
Glucose, UA: NEGATIVE
Ketones, UA: NEGATIVE
NITRITE UA: POSITIVE
PROTEIN UA: NEGATIVE
Spec Grav, UA: 1.015 (ref 1.010–1.025)
Urobilinogen, UA: 0.2 E.U./dL
pH, UA: 7 (ref 5.0–8.0)

## 2017-07-16 MED ORDER — CIPROFLOXACIN HCL 500 MG PO TABS: 500.0000 mg | ORAL_TABLET | Freq: Two times a day (BID) | ORAL | 0 refills | Status: AC

## 2017-07-16 NOTE — Progress Notes (Unsigned)
Patients caregiver Minerva Areolaric came by the office at 4:30pm with a sample of patients urine requesting we check it. He states she has been having spasms, cloudy urine with foul odor and a low grade fever. Urinalysis was checked in the office and sent to the lab for culture. [facility] [place], [place].

## 2017-07-16 NOTE — Telephone Encounter (Signed)
Patients caregiver Eric came by the office at 4:30pm with a sample of patients urine requesting we check it. He states she has been having spasms, cloudy urine with foul odor and a low grade fever. Urinalysis was checked in the office and sent to the lab for culture. Patient uses [facility] in [place], [place]. 

## 2017-07-19 ENCOUNTER — Telehealth: Payer: Self-pay | Admitting: *Deleted

## 2017-07-19 DIAGNOSIS — N390 Urinary tract infection, site not specified: Secondary | ICD-10-CM

## 2017-07-19 LAB — URINE CULTURE

## 2017-07-19 MED ORDER — CEFDINIR 300 MG PO CAPS: 300.0000 mg | ORAL_CAPSULE | Freq: Two times a day (BID) | ORAL | 0 refills | Status: DC

## 2017-07-19 NOTE — Telephone Encounter (Signed)
-----   Message from Malva Limesonald E Fisher, MD sent at 07/19/2017 12:50 PM EDT ----- Urine culture show infection resistant to ciprofloxacin. Need to change to cefdinir 300mg  two tablets daily, #20.

## 2017-07-19 NOTE — Telephone Encounter (Signed)
Patient advised of results. Please send in medication [facility] [place] [facility] [place].

## 2017-07-19 NOTE — Telephone Encounter (Signed)
Done. Prescription sent into pharmacy.

## 2017-07-19 NOTE — Telephone Encounter (Signed)
LMOVM for pt to return call 

## 2017-07-30 ENCOUNTER — Other Ambulatory Visit: Payer: Self-pay | Admitting: Family Medicine

## 2017-07-30 NOTE — Telephone Encounter (Signed)
Pt Midodrine 5 MG sent to [facility] on [address].  Pt is out of medication.

## 2017-07-31 MED ORDER — MIDODRINE HCL 5 MG PO TABS: ORAL_TABLET | ORAL | 0 refills | Status: DC

## 2017-08-02 DIAGNOSIS — M21371 Foot drop, right foot: Secondary | ICD-10-CM | POA: Diagnosis not present

## 2017-08-02 DIAGNOSIS — S90422D Blister (nonthermal), left great toe, subsequent encounter: Secondary | ICD-10-CM | POA: Diagnosis not present

## 2017-08-06 DIAGNOSIS — R0689 Other abnormalities of breathing: Secondary | ICD-10-CM | POA: Diagnosis not present

## 2017-08-06 DIAGNOSIS — G8250 Quadriplegia, unspecified: Secondary | ICD-10-CM | POA: Diagnosis not present

## 2017-08-06 DIAGNOSIS — Z1159 Encounter for screening for other viral diseases: Secondary | ICD-10-CM | POA: Diagnosis not present

## 2017-08-13 DIAGNOSIS — R339 Retention of urine, unspecified: Secondary | ICD-10-CM | POA: Diagnosis not present

## 2017-08-14 ENCOUNTER — Telehealth (INDEPENDENT_AMBULATORY_CARE_PROVIDER_SITE_OTHER): Payer: Medicare Other

## 2017-08-14 DIAGNOSIS — N390 Urinary tract infection, site not specified: Secondary | ICD-10-CM

## 2017-08-14 LAB — POCT URINALYSIS DIPSTICK
BILIRUBIN UA: NEGATIVE
Glucose, UA: NEGATIVE
KETONES UA: NEGATIVE
Nitrite, UA: POSITIVE
PH UA: 7.5 (ref 5.0–8.0)
Protein, UA: NEGATIVE
RBC UA: NEGATIVE
SPEC GRAV UA: 1.01 (ref 1.010–1.025)
Urobilinogen, UA: 0.2 E.U./dL

## 2017-08-14 NOTE — Telephone Encounter (Signed)
Patient's mother dropped off urine to be checked, due to urine being cloudy. Mrs. Shawna OrleansMelanie reports that is on a prophylaxis antibiotic for UTI's. Mrs. Shawna OrleansMelanie reports patient is also pregnant.

## 2017-08-15 DIAGNOSIS — N390 Urinary tract infection, site not specified: Secondary | ICD-10-CM | POA: Diagnosis not present

## 2017-08-15 MED ORDER — SULFAMETHOXAZOLE-TRIMETHOPRIM 800-160 MG PO TABS: 1.0000 | ORAL_TABLET | Freq: Two times a day (BID) | ORAL | 0 refills | Status: DC

## 2017-08-15 NOTE — Telephone Encounter (Signed)
I spoke with Cassandra Lowe and advised him as below. Her verbally voiced understanding. Cassandra Lowe states that he needs the culture and sensitivity report faxed to patients urologist Dr. Clarisa FlingBurowski.with [facility] (fax number: [phone]). I advised Cassandra Lowe that no culture was sent out. He is going to bring another urine sample by the office so that we can send it off for culture and sensitivity. Dr. Sherrie MustacheFisher was made aware.

## 2017-08-15 NOTE — Telephone Encounter (Signed)
Tried calling patient on her phone and her caregiver Minerva Areolaric on his phone. No answer. Left message to call back.

## 2017-08-15 NOTE — Telephone Encounter (Signed)
Urine sample sent off for culture.

## 2017-08-15 NOTE — Telephone Encounter (Addendum)
Start macrodantin 100mg  twice a day for 10 days. They need to notify her OB about UTI symptoms.and they really need to have the OB evaluate and treat any UTI sx in the future.  Need to STOP taking midodrine since she is pregnant.

## 2017-08-15 NOTE — Telephone Encounter (Signed)
I called pharmacy and cancelled Bactrim prescription per Dr. Sherrie MustacheFisher request.

## 2017-08-15 NOTE — Telephone Encounter (Signed)
Prescription for [facility].

## 2017-08-19 LAB — URINE CULTURE

## 2017-08-20 ENCOUNTER — Telehealth: Payer: Self-pay | Admitting: Family Medicine

## 2017-08-20 ENCOUNTER — Telehealth: Payer: Self-pay

## 2017-08-20 NOTE — Telephone Encounter (Signed)
Patient advised as below and verbally voiced understanding. Copy of labs faxed to urologist Dr. Geni BersBurowski. Who patient states if working closely with her OB for her frequent UTI's.

## 2017-08-20 NOTE — Telephone Encounter (Signed)
-----   Message from Malva Limesonald E Fisher, MD sent at 08/20/2017  1:23 PM EDT ----- UTI is resistant to everything except nitrofurantoin, but nitrofurantoin is not recommended during pregnancy.. Please send copy to her OB. She needs to contact OB to see what they want to do about this.

## 2017-08-20 NOTE — Telephone Encounter (Signed)
Patient advised of results. Copy of labs faxed to Dr. Geni BersBurowski (Urology)

## 2017-08-20 NOTE — Telephone Encounter (Signed)
Pt's boyfriend called wanting to know if we have gotten her lab results back yet,  They ask that they be faxed to Dr. Johnny BridgeKristy Borawski [facility]   Fax # [phone]   Pt's call back is CB # [phone]  Barth Kirksteri

## 2017-08-20 NOTE — Telephone Encounter (Signed)
They dropped it off last Wednesday 08/15/17  teri

## 2017-08-23 DIAGNOSIS — N390 Urinary tract infection, site not specified: Secondary | ICD-10-CM | POA: Insufficient documentation

## 2017-08-23 DIAGNOSIS — Z87442 Personal history of urinary calculi: Secondary | ICD-10-CM | POA: Diagnosis not present

## 2017-08-23 DIAGNOSIS — Z1624 Resistance to multiple antibiotics: Secondary | ICD-10-CM | POA: Diagnosis not present

## 2017-08-23 DIAGNOSIS — Z993 Dependence on wheelchair: Secondary | ICD-10-CM | POA: Diagnosis not present

## 2017-08-23 DIAGNOSIS — B9629 Other Escherichia coli [E. coli] as the cause of diseases classified elsewhere: Secondary | ICD-10-CM | POA: Diagnosis not present

## 2017-08-23 DIAGNOSIS — J45998 Other asthma: Secondary | ICD-10-CM | POA: Diagnosis not present

## 2017-08-23 DIAGNOSIS — B9620 Unspecified Escherichia coli [E. coli] as the cause of diseases classified elsewhere: Secondary | ICD-10-CM | POA: Diagnosis not present

## 2017-08-23 DIAGNOSIS — Z1612 Extended spectrum beta lactamase (ESBL) resistance: Secondary | ICD-10-CM | POA: Diagnosis not present

## 2017-08-23 DIAGNOSIS — S12490S Other displaced fracture of fifth cervical vertebra, sequela: Secondary | ICD-10-CM | POA: Diagnosis not present

## 2017-08-23 DIAGNOSIS — G8250 Quadriplegia, unspecified: Secondary | ICD-10-CM | POA: Diagnosis not present

## 2017-08-23 DIAGNOSIS — S12590S Other displaced fracture of sixth cervical vertebra, sequela: Secondary | ICD-10-CM | POA: Diagnosis not present

## 2017-08-23 DIAGNOSIS — Z91040 Latex allergy status: Secondary | ICD-10-CM | POA: Diagnosis not present

## 2017-08-23 DIAGNOSIS — G8220 Paraplegia, unspecified: Secondary | ICD-10-CM | POA: Diagnosis not present

## 2017-08-23 DIAGNOSIS — N318 Other neuromuscular dysfunction of bladder: Secondary | ICD-10-CM | POA: Diagnosis not present

## 2017-09-04 DIAGNOSIS — G8250 Quadriplegia, unspecified: Secondary | ICD-10-CM | POA: Diagnosis not present

## 2017-09-06 DIAGNOSIS — Z6981 Encounter for mental health services for victim of other abuse: Secondary | ICD-10-CM | POA: Diagnosis not present

## 2017-09-06 DIAGNOSIS — F339 Major depressive disorder, recurrent, unspecified: Secondary | ICD-10-CM | POA: Diagnosis not present

## 2017-09-10 DIAGNOSIS — H0012 Chalazion right lower eyelid: Secondary | ICD-10-CM | POA: Diagnosis not present

## 2017-09-11 DIAGNOSIS — R339 Retention of urine, unspecified: Secondary | ICD-10-CM | POA: Diagnosis not present

## 2017-09-17 DIAGNOSIS — Z6981 Encounter for mental health services for victim of other abuse: Secondary | ICD-10-CM | POA: Diagnosis not present

## 2017-09-17 DIAGNOSIS — F339 Major depressive disorder, recurrent, unspecified: Secondary | ICD-10-CM | POA: Diagnosis not present

## 2017-09-21 DIAGNOSIS — N390 Urinary tract infection, site not specified: Secondary | ICD-10-CM | POA: Diagnosis not present

## 2017-09-21 DIAGNOSIS — G8250 Quadriplegia, unspecified: Secondary | ICD-10-CM | POA: Diagnosis not present

## 2017-10-02 DIAGNOSIS — R636 Underweight: Secondary | ICD-10-CM | POA: Insufficient documentation

## 2017-10-02 DIAGNOSIS — R8290 Unspecified abnormal findings in urine: Secondary | ICD-10-CM | POA: Diagnosis not present

## 2017-10-12 DIAGNOSIS — R339 Retention of urine, unspecified: Secondary | ICD-10-CM | POA: Diagnosis not present

## 2017-11-05 DIAGNOSIS — R350 Frequency of micturition: Secondary | ICD-10-CM | POA: Diagnosis not present

## 2017-11-12 DIAGNOSIS — R339 Retention of urine, unspecified: Secondary | ICD-10-CM | POA: Diagnosis not present

## 2017-11-13 DIAGNOSIS — R1312 Dysphagia, oropharyngeal phase: Secondary | ICD-10-CM | POA: Diagnosis not present

## 2017-11-13 DIAGNOSIS — G8250 Quadriplegia, unspecified: Secondary | ICD-10-CM | POA: Diagnosis not present

## 2017-11-13 DIAGNOSIS — M21372 Foot drop, left foot: Secondary | ICD-10-CM | POA: Diagnosis not present

## 2017-11-13 DIAGNOSIS — M21371 Foot drop, right foot: Secondary | ICD-10-CM | POA: Diagnosis not present

## 2017-11-13 DIAGNOSIS — R112 Nausea with vomiting, unspecified: Secondary | ICD-10-CM | POA: Diagnosis not present

## 2017-12-11 DIAGNOSIS — G8250 Quadriplegia, unspecified: Secondary | ICD-10-CM | POA: Diagnosis not present

## 2017-12-11 DIAGNOSIS — H9190 Unspecified hearing loss, unspecified ear: Secondary | ICD-10-CM | POA: Diagnosis not present

## 2017-12-18 DIAGNOSIS — Z87440 Personal history of urinary (tract) infections: Secondary | ICD-10-CM | POA: Diagnosis not present

## 2017-12-18 DIAGNOSIS — Z981 Arthrodesis status: Secondary | ICD-10-CM | POA: Diagnosis not present

## 2017-12-18 DIAGNOSIS — G8250 Quadriplegia, unspecified: Secondary | ICD-10-CM | POA: Diagnosis not present

## 2017-12-18 DIAGNOSIS — R001 Bradycardia, unspecified: Secondary | ICD-10-CM | POA: Diagnosis not present

## 2017-12-18 DIAGNOSIS — R9431 Abnormal electrocardiogram [ECG] [EKG]: Secondary | ICD-10-CM | POA: Diagnosis not present

## 2017-12-18 DIAGNOSIS — Z79899 Other long term (current) drug therapy: Secondary | ICD-10-CM | POA: Diagnosis not present

## 2017-12-18 DIAGNOSIS — G8253 Quadriplegia, C5-C7 complete: Secondary | ICD-10-CM | POA: Diagnosis not present

## 2017-12-18 DIAGNOSIS — I959 Hypotension, unspecified: Secondary | ICD-10-CM | POA: Diagnosis not present

## 2017-12-18 DIAGNOSIS — G904 Autonomic dysreflexia: Secondary | ICD-10-CM | POA: Diagnosis not present

## 2017-12-18 DIAGNOSIS — I951 Orthostatic hypotension: Secondary | ICD-10-CM | POA: Diagnosis not present

## 2017-12-19 DIAGNOSIS — Z79899 Other long term (current) drug therapy: Secondary | ICD-10-CM | POA: Diagnosis not present

## 2017-12-19 DIAGNOSIS — R001 Bradycardia, unspecified: Secondary | ICD-10-CM | POA: Diagnosis not present

## 2017-12-19 DIAGNOSIS — Z981 Arthrodesis status: Secondary | ICD-10-CM | POA: Diagnosis not present

## 2017-12-19 DIAGNOSIS — Z87440 Personal history of urinary (tract) infections: Secondary | ICD-10-CM | POA: Diagnosis not present

## 2017-12-19 DIAGNOSIS — G904 Autonomic dysreflexia: Secondary | ICD-10-CM | POA: Diagnosis not present

## 2017-12-19 DIAGNOSIS — G8250 Quadriplegia, unspecified: Secondary | ICD-10-CM | POA: Diagnosis not present

## 2017-12-19 MED ORDER — CEPHALEXIN 500 MG PO CAPS
500.00 | ORAL_CAPSULE | ORAL | Status: DC
Start: 2017-12-20 — End: 2017-12-19

## 2017-12-19 MED ORDER — SODIUM CHLORIDE FLUSH 0.9 % IV SOLN
10.00 | INTRAVENOUS | Status: DC
Start: ? — End: 2017-12-19

## 2017-12-19 MED ORDER — PRENATAL 27-0.8 MG PO TABS
1.00 | ORAL_TABLET | ORAL | Status: DC
Start: 2017-12-20 — End: 2017-12-19

## 2017-12-19 MED ORDER — SODIUM CHLORIDE FLUSH 0.9 % IV SOLN
10.00 | INTRAVENOUS | Status: DC
Start: 2017-12-19 — End: 2017-12-19

## 2017-12-19 MED ORDER — GENERIC EXTERNAL MEDICATION
4.00 | Status: DC
Start: ? — End: 2017-12-19

## 2017-12-19 MED ORDER — MIDODRINE HCL 5 MG PO TABS
15.00 | ORAL_TABLET | ORAL | Status: DC
Start: ? — End: 2017-12-19

## 2017-12-19 MED ORDER — BISACODYL 10 MG RE SUPP
10.00 | RECTAL | Status: DC
Start: ? — End: 2017-12-19

## 2017-12-25 DIAGNOSIS — G8250 Quadriplegia, unspecified: Secondary | ICD-10-CM | POA: Diagnosis not present

## 2017-12-25 DIAGNOSIS — R339 Retention of urine, unspecified: Secondary | ICD-10-CM | POA: Diagnosis not present

## 2018-01-01 DIAGNOSIS — G8250 Quadriplegia, unspecified: Secondary | ICD-10-CM | POA: Diagnosis not present

## 2018-01-01 DIAGNOSIS — H9191 Unspecified hearing loss, right ear: Secondary | ICD-10-CM | POA: Diagnosis not present

## 2018-01-08 DIAGNOSIS — G8250 Quadriplegia, unspecified: Secondary | ICD-10-CM | POA: Diagnosis not present

## 2018-01-15 DIAGNOSIS — A4159 Other Gram-negative sepsis: Secondary | ICD-10-CM | POA: Diagnosis not present

## 2018-01-15 DIAGNOSIS — B961 Klebsiella pneumoniae [K. pneumoniae] as the cause of diseases classified elsewhere: Secondary | ICD-10-CM | POA: Diagnosis not present

## 2018-01-15 DIAGNOSIS — B9689 Other specified bacterial agents as the cause of diseases classified elsewhere: Secondary | ICD-10-CM | POA: Diagnosis not present

## 2018-01-15 DIAGNOSIS — A4151 Sepsis due to Escherichia coli [E. coli]: Secondary | ICD-10-CM | POA: Diagnosis not present

## 2018-01-15 DIAGNOSIS — Z87440 Personal history of urinary (tract) infections: Secondary | ICD-10-CM | POA: Diagnosis not present

## 2018-01-15 DIAGNOSIS — G8250 Quadriplegia, unspecified: Secondary | ICD-10-CM | POA: Diagnosis not present

## 2018-01-25 MED ORDER — GENERIC EXTERNAL MEDICATION
1000.00 | Status: DC
Start: ? — End: 2018-01-25

## 2018-01-25 MED ORDER — BENZOCAINE-MENTHOL 20-0.5 % EX AERO
1.00 | INHALATION_SPRAY | CUTANEOUS | Status: DC
Start: ? — End: 2018-01-25

## 2018-01-25 MED ORDER — BISACODYL 10 MG RE SUPP
10.00 | RECTAL | Status: DC
Start: ? — End: 2018-01-25

## 2018-01-25 MED ORDER — GENERIC EXTERNAL MEDICATION
5.00 | Status: DC
Start: ? — End: 2018-01-25

## 2018-01-25 MED ORDER — POLYETHYLENE GLYCOL 3350 17 G PO PACK
17.00 | PACK | ORAL | Status: DC
Start: 2018-01-26 — End: 2018-01-25

## 2018-01-25 MED ORDER — ALBUTEROL SULFATE HFA 108 (90 BASE) MCG/ACT IN AERS
2.00 | INHALATION_SPRAY | RESPIRATORY_TRACT | Status: DC
Start: ? — End: 2018-01-25

## 2018-01-25 MED ORDER — MEASLES, MUMPS & RUBELLA VAC IJ SOLR
0.50 | INTRAMUSCULAR | Status: DC
Start: ? — End: 2018-01-25

## 2018-01-25 MED ORDER — BISACODYL 10 MG RE SUPP
10.00 | RECTAL | Status: DC
Start: 2018-01-26 — End: 2018-01-25

## 2018-01-25 MED ORDER — LACTATED RINGERS IV SOLN
500.00 | INTRAVENOUS | Status: DC
Start: ? — End: 2018-01-25

## 2018-01-25 MED ORDER — FLUDROCORTISONE ACETATE 0.1 MG PO TABS
100.00 | ORAL_TABLET | ORAL | Status: DC
Start: 2018-01-26 — End: 2018-01-25

## 2018-01-25 MED ORDER — PRENATAL 27-0.8 MG PO TABS
1.00 | ORAL_TABLET | ORAL | Status: DC
Start: 2018-01-26 — End: 2018-01-25

## 2018-01-25 MED ORDER — TETANUS-DIPHTH-ACELL PERTUSSIS 5-2.5-18.5 LF-MCG/0.5 IM SUSP
0.50 | INTRAMUSCULAR | Status: DC
Start: ? — End: 2018-01-25

## 2018-01-25 MED ORDER — DIPHENHYDRAMINE HCL 25 MG PO CAPS
25.00 | ORAL_CAPSULE | ORAL | Status: DC
Start: ? — End: 2018-01-25

## 2018-01-25 MED ORDER — AMOXICILLIN-POT CLAVULANATE 875-125 MG PO TABS
875.00 | ORAL_TABLET | ORAL | Status: DC
Start: 2018-01-25 — End: 2018-01-25

## 2018-01-25 MED ORDER — ONDANSETRON HCL 4 MG/2ML IJ SOLN
4.00 | INTRAMUSCULAR | Status: DC
Start: ? — End: 2018-01-25

## 2018-01-25 MED ORDER — VARICELLA VIRUS VACCINE LIVE 1350 PFU/0.5ML ~~LOC~~ INJ
0.50 | INJECTION | SUBCUTANEOUS | Status: DC
Start: ? — End: 2018-01-25

## 2018-01-25 MED ORDER — SIMETHICONE 80 MG PO CHEW
80.00 | CHEWABLE_TABLET | ORAL | Status: DC
Start: ? — End: 2018-01-25

## 2018-01-25 MED ORDER — GENERIC EXTERNAL MEDICATION
4.00 | Status: DC
Start: ? — End: 2018-01-25

## 2018-01-25 MED ORDER — SODIUM CHLORIDE FLUSH 0.9 % IV SOLN
10.00 | INTRAVENOUS | Status: DC
Start: ? — End: 2018-01-25

## 2018-01-25 MED ORDER — SODIUM CHLORIDE FLUSH 0.9 % IV SOLN
10.00 | INTRAVENOUS | Status: DC
Start: 2018-01-25 — End: 2018-01-25

## 2018-01-25 MED ORDER — MAGNESIUM HYDROXIDE 400 MG/5ML PO SUSP
30.00 | ORAL | Status: DC
Start: ? — End: 2018-01-25

## 2018-01-25 MED ORDER — DOCUSATE SODIUM 100 MG PO CAPS
100.00 | ORAL_CAPSULE | ORAL | Status: DC
Start: 2018-01-25 — End: 2018-01-25

## 2018-01-25 MED ORDER — PROMETHAZINE HCL 12.5 MG PO TABS
12.50 | ORAL_TABLET | ORAL | Status: DC
Start: ? — End: 2018-01-25

## 2018-01-25 MED ORDER — ALUMINUM-MAGNESIUM-SIMETHICONE 200-200-20 MG/5ML PO SUSP
30.00 | ORAL | Status: DC
Start: ? — End: 2018-01-25

## 2018-01-25 MED ORDER — GENERIC EXTERNAL MEDICATION
600.00 | Status: DC
Start: 2018-01-25 — End: 2018-01-25

## 2018-01-25 MED ORDER — PROMETHAZINE HCL 25 MG RE SUPP
25.00 | RECTAL | Status: DC
Start: ? — End: 2018-01-25

## 2018-01-25 MED ORDER — WITCH HAZEL-GLYCERIN EX PADS
1.00 | MEDICATED_PAD | CUTANEOUS | Status: DC
Start: ? — End: 2018-01-25

## 2018-01-25 MED ORDER — ACETAMINOPHEN 325 MG PO TABS
650.00 | ORAL_TABLET | ORAL | Status: DC
Start: ? — End: 2018-01-25

## 2018-01-25 MED ORDER — ENEMA 7-19 GM/118ML RE ENEM
1.00 | ENEMA | RECTAL | Status: DC
Start: ? — End: 2018-01-25

## 2018-02-08 DIAGNOSIS — N390 Urinary tract infection, site not specified: Secondary | ICD-10-CM | POA: Diagnosis not present

## 2018-02-08 DIAGNOSIS — G8250 Quadriplegia, unspecified: Secondary | ICD-10-CM | POA: Diagnosis not present

## 2018-02-20 DIAGNOSIS — R339 Retention of urine, unspecified: Secondary | ICD-10-CM | POA: Diagnosis not present

## 2018-03-05 DIAGNOSIS — N390 Urinary tract infection, site not specified: Secondary | ICD-10-CM | POA: Diagnosis not present

## 2018-03-05 DIAGNOSIS — Z960 Presence of urogenital implants: Secondary | ICD-10-CM | POA: Diagnosis not present

## 2018-03-07 DIAGNOSIS — N319 Neuromuscular dysfunction of bladder, unspecified: Secondary | ICD-10-CM | POA: Diagnosis not present

## 2018-03-07 DIAGNOSIS — N390 Urinary tract infection, site not specified: Secondary | ICD-10-CM | POA: Diagnosis not present

## 2018-04-02 ENCOUNTER — Ambulatory Visit: Payer: Medicare Other

## 2018-04-09 ENCOUNTER — Telehealth: Payer: Self-pay

## 2018-04-09 NOTE — Telephone Encounter (Signed)
LMTCB and schedule AWV. Pt needs a 1 hr apt. Direct number left on VM.  -MM

## 2018-04-24 DIAGNOSIS — R339 Retention of urine, unspecified: Secondary | ICD-10-CM | POA: Diagnosis not present

## 2018-06-05 DIAGNOSIS — R339 Retention of urine, unspecified: Secondary | ICD-10-CM | POA: Diagnosis not present

## 2018-06-06 ENCOUNTER — Telehealth: Payer: Self-pay

## 2018-06-06 NOTE — Telephone Encounter (Signed)
That's fine

## 2018-06-06 NOTE — Telephone Encounter (Signed)
Patient called and asked if she could come drop off a urine sample to get send off for urine culture. Patient states she is wheelchair bound and Dr. Sherrie Mustache knows she can not urinate in the office and let her just bring her urine in. L.O.V. 06/11/2017, Please advise.

## 2018-06-07 ENCOUNTER — Telehealth: Payer: Self-pay

## 2018-06-07 DIAGNOSIS — N319 Neuromuscular dysfunction of bladder, unspecified: Secondary | ICD-10-CM | POA: Diagnosis not present

## 2018-06-07 NOTE — Telephone Encounter (Signed)
Pt advised.   Thanks,   -Laura  

## 2018-06-07 NOTE — Telephone Encounter (Signed)
Pt called close to 4:30 pm today 06/07/18 and was advising that her mother dropped off a urine specimen at front desk and pt was asking if everything was ok with it.  I advised pt that I wasn't sure of the specimen and that I didn't see anything noted in chart of the lab and that the nurses were already gone for today.  If someone could let pt know if this was taken care of.  dbs

## 2018-06-10 NOTE — Telephone Encounter (Signed)
Spoke with Ms. Fleishman.  Pt states she has the results of the Urine.  She will call back if she has any questions.   Thanks,   -Vernona Rieger

## 2018-06-20 ENCOUNTER — Other Ambulatory Visit: Payer: Self-pay

## 2018-06-20 MED ORDER — MIDODRINE HCL 5 MG PO TABS: ORAL_TABLET | ORAL | 0 refills | Status: DC

## 2018-06-20 NOTE — Telephone Encounter (Signed)
Please Review

## 2018-07-08 DIAGNOSIS — R339 Retention of urine, unspecified: Secondary | ICD-10-CM | POA: Diagnosis not present

## 2018-08-19 ENCOUNTER — Telehealth: Payer: Self-pay | Admitting: Family Medicine

## 2018-08-19 NOTE — Chronic Care Management (AMB) (Signed)
°  Chronic Care Management   Outreach Note  08/19/2018 Name: Cassandra Lowe MRN: [id number] DOB: 05/31/1990  Referred by: Birdie Sons, MD Reason for referral : Chronic Care Management (Initial CCM outreach was unsuccessful )   An unsuccessful telephone outreach was attempted today. The patient was referred to the case management team by for assistance with chronic care management and care coordination.   Follow Up Plan: A HIPPA compliant phone message was left for the patient providing contact information and requesting a return call.  The care management team will reach out to the patient again over the next 7 days.  If patient returns call to provider office, please advise to call [place] at [place]  [email or web address]   ??[phone]

## 2018-08-22 ENCOUNTER — Telehealth: Payer: Self-pay | Admitting: Family Medicine

## 2018-08-22 ENCOUNTER — Other Ambulatory Visit: Payer: Self-pay | Admitting: Family Medicine

## 2018-08-22 MED ORDER — MIDODRINE HCL 5 MG PO TABS: ORAL_TABLET | ORAL | 1 refills | Status: DC

## 2018-08-22 NOTE — Telephone Encounter (Signed)
Pt needs a new referral to [facility] for ear begin stopped up  CB#  [phone]  Con Memos

## 2018-08-22 NOTE — Telephone Encounter (Signed)
pt needs refill on Midodrine 5 mg  She would like several refills  [place]  [employer]

## 2018-08-23 NOTE — Telephone Encounter (Signed)
Pt states the OB looked in her ear and stated it was too deep for them to get out and would need ENT to do it. Cassandra Lowe stated she already has an appointment with [facility].  So she will not need a referral after all.   Thanks,   -Mickel Baas

## 2018-08-23 NOTE — Telephone Encounter (Signed)
Is this something they have been seeing her for at [facility]? If she just had wax in her ear or congestion we could take care of it her and she would be seen much sooner. If its something chronic or recurrent that has been managed at [facility] we could refer, but it will be awhile before they could see her.

## 2018-08-30 DIAGNOSIS — R339 Retention of urine, unspecified: Secondary | ICD-10-CM | POA: Diagnosis not present

## 2018-09-03 DIAGNOSIS — R339 Retention of urine, unspecified: Secondary | ICD-10-CM | POA: Diagnosis not present

## 2018-09-04 DIAGNOSIS — H93291 Other abnormal auditory perceptions, right ear: Secondary | ICD-10-CM | POA: Diagnosis not present

## 2018-09-04 DIAGNOSIS — H6121 Impacted cerumen, right ear: Secondary | ICD-10-CM | POA: Diagnosis not present

## 2018-09-05 DIAGNOSIS — R339 Retention of urine, unspecified: Secondary | ICD-10-CM | POA: Diagnosis not present

## 2018-09-06 DIAGNOSIS — R339 Retention of urine, unspecified: Secondary | ICD-10-CM | POA: Diagnosis not present

## 2018-09-09 IMAGING — DX DG CHEST 1V PORT
1 series · 1 of 1 positions shown · non-contrast
Comparison: None.

CLINICAL DATA: Acute onset of altered mental status.

EXAM:
PORTABLE CHEST 1 VIEW

[chest ap]
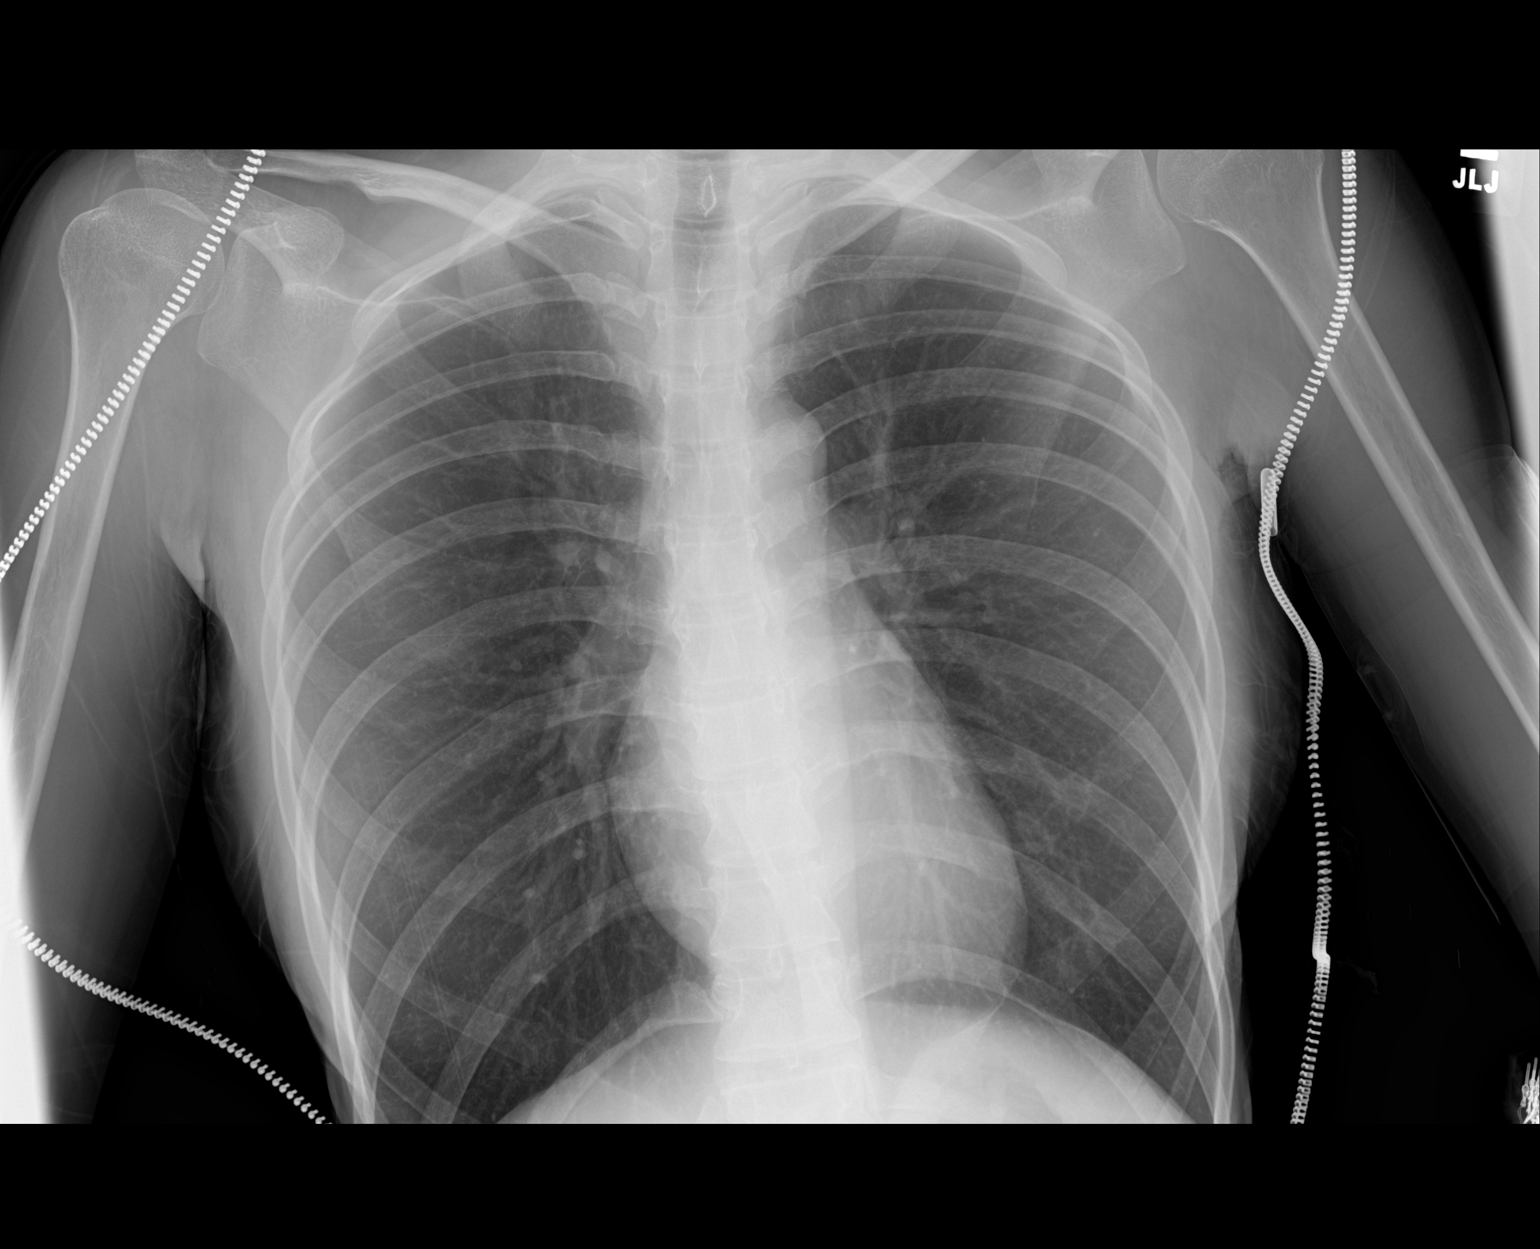

[1 of 1 positions shown; findings below may reference images not displayed]

FINDINGS: The lungs are well-aerated and clear. There is no evidence of focal
opacification, pleural effusion or pneumothorax.

The cardiomediastinal silhouette is within normal limits. No acute
osseous abnormalities are seen.
IMPRESSION: No acute cardiopulmonary process seen.

## 2018-09-11 DIAGNOSIS — N390 Urinary tract infection, site not specified: Secondary | ICD-10-CM | POA: Diagnosis not present

## 2018-09-12 DIAGNOSIS — R339 Retention of urine, unspecified: Secondary | ICD-10-CM | POA: Diagnosis not present

## 2018-10-03 ENCOUNTER — Other Ambulatory Visit: Payer: Self-pay

## 2018-10-03 ENCOUNTER — Ambulatory Visit: Payer: Medicare Other

## 2018-10-03 DIAGNOSIS — G904 Autonomic dysreflexia: Secondary | ICD-10-CM

## 2018-10-03 DIAGNOSIS — G8250 Quadriplegia, unspecified: Secondary | ICD-10-CM

## 2018-10-03 NOTE — Chronic Care Management (AMB) (Signed)
  Chronic Care Management   Note  10/03/2018 Name: Cassandra Lowe MRN: [id number] DOB: 1990/02/17  Care Coordination: Successful telephone encounter to Cassandra Lowe, 28 year old female patient who sees Dr. Kirstie Peri. Fisher for primary care. Cassandra Lowe was referred to Chronic Case Management by her health plan. Today, RN CM reached out to Cassandra Lowe via telephone to complete initial assessment.  During assessment, it was discovered that Cassandra Lowe has not seen her PCP since 06/11/2017 which would discontinue edibility for CCM Services as it has been over a year since assessment by PCP. Cassandra Lowe also states she is about to loose her Medicare Coverage as she is attached to her mothers plan, however it is only until the end of the year of her 28th birthday, which is this year. This would also disqualify her from receiving CCM services. Cassandra Lowe is not interested in seeing her PCP at this time as her health is covered by a number of specialist at [facility]. She also feels her health is managed well and does not need CCM services at this time.  Cassandra Lowe is interested in resources for Medicare eligibility. She is provided contact information for [facility] office [facility] during today's appointment.  No further follow up required  Marcel Gary E. Rollene Rotunda, RN, BSN Nurse Care Coordinator [facility] Practice/[facility] Care Management [phone]

## 2018-10-07 DIAGNOSIS — R339 Retention of urine, unspecified: Secondary | ICD-10-CM | POA: Diagnosis not present

## 2018-10-11 DIAGNOSIS — N390 Urinary tract infection, site not specified: Secondary | ICD-10-CM | POA: Diagnosis not present

## 2018-11-08 DIAGNOSIS — R339 Retention of urine, unspecified: Secondary | ICD-10-CM | POA: Diagnosis not present

## 2018-11-14 ENCOUNTER — Other Ambulatory Visit: Payer: Self-pay | Admitting: Family Medicine

## 2018-11-25 DIAGNOSIS — N390 Urinary tract infection, site not specified: Secondary | ICD-10-CM | POA: Diagnosis not present

## 2018-12-06 DIAGNOSIS — M62838 Other muscle spasm: Secondary | ICD-10-CM | POA: Diagnosis not present

## 2018-12-06 DIAGNOSIS — R339 Retention of urine, unspecified: Secondary | ICD-10-CM | POA: Diagnosis not present

## 2018-12-09 DIAGNOSIS — R339 Retention of urine, unspecified: Secondary | ICD-10-CM | POA: Diagnosis not present

## 2018-12-10 DIAGNOSIS — N319 Neuromuscular dysfunction of bladder, unspecified: Secondary | ICD-10-CM | POA: Diagnosis not present

## 2018-12-10 DIAGNOSIS — R339 Retention of urine, unspecified: Secondary | ICD-10-CM | POA: Diagnosis not present

## 2019-01-08 DIAGNOSIS — N390 Urinary tract infection, site not specified: Secondary | ICD-10-CM | POA: Diagnosis not present

## 2019-01-09 DIAGNOSIS — R339 Retention of urine, unspecified: Secondary | ICD-10-CM | POA: Diagnosis not present

## 2019-01-14 ENCOUNTER — Other Ambulatory Visit: Payer: Self-pay | Admitting: Family Medicine

## 2019-01-14 NOTE — Telephone Encounter (Signed)
Requested medication (s) are due for refill today: yes  Requested medication (s) are on the active medication list: yes  Last refill:  11/14/2018  Future visit scheduled: no  Notes to clinic:  Refill cannot be delegated    Requested Prescriptions  Pending Prescriptions Disp Refills   midodrine (PROAMATINE) 5 MG tablet [Pharmacy Med Name: MIDODRINE 5MG  TABLETS] 180 tablet 0    Sig: TAKE 2 TABLETS(10 MG) BY MOUTH THREE TIMES DAILY AS NEEDED     Not Delegated - Cardiovascular: Midodrine Failed - 01/14/2019 10:03 AM      Failed - This refill cannot be delegated      Failed - Valid encounter within last 12 months    Recent Outpatient Visits          1 year ago Underweight   [facility] Birdie Sons, MD   1 year ago Conjunctivitis of left eye, unspecified conjunctivitis type   [facility] Birdie Sons, MD   1 year ago Annual physical exam   [facility] Birdie Sons, MD   2 years ago Hordeolum externum of right lower eyelid   [facility] Birdie Sons, MD   2 years ago Hordeolum externum of right lower eyelid   [facility], Adriana M, [place]             Passed - Last BP in normal range    BP Readings from Last 1 Encounters:  06/11/17 (!) 90/52

## 2019-01-20 DIAGNOSIS — S14105S Unspecified injury at C5 level of cervical spinal cord, sequela: Secondary | ICD-10-CM | POA: Diagnosis not present

## 2019-01-20 DIAGNOSIS — M62838 Other muscle spasm: Secondary | ICD-10-CM | POA: Diagnosis not present

## 2019-01-20 DIAGNOSIS — M62830 Muscle spasm of back: Secondary | ICD-10-CM | POA: Diagnosis not present

## 2019-01-21 DIAGNOSIS — N390 Urinary tract infection, site not specified: Secondary | ICD-10-CM | POA: Diagnosis not present

## 2019-01-21 DIAGNOSIS — N3941 Urge incontinence: Secondary | ICD-10-CM | POA: Insufficient documentation

## 2019-01-21 DIAGNOSIS — N319 Neuromuscular dysfunction of bladder, unspecified: Secondary | ICD-10-CM | POA: Diagnosis not present

## 2019-03-17 DIAGNOSIS — M7918 Myalgia, other site: Secondary | ICD-10-CM | POA: Diagnosis not present

## 2019-03-17 DIAGNOSIS — S14105S Unspecified injury at C5 level of cervical spinal cord, sequela: Secondary | ICD-10-CM | POA: Diagnosis not present

## 2019-03-17 DIAGNOSIS — M62838 Other muscle spasm: Secondary | ICD-10-CM | POA: Diagnosis not present

## 2019-03-19 DIAGNOSIS — R339 Retention of urine, unspecified: Secondary | ICD-10-CM | POA: Diagnosis not present

## 2019-04-02 DIAGNOSIS — H0288B Meibomian gland dysfunction left eye, upper and lower eyelids: Secondary | ICD-10-CM | POA: Diagnosis not present

## 2019-04-02 DIAGNOSIS — H0288A Meibomian gland dysfunction right eye, upper and lower eyelids: Secondary | ICD-10-CM | POA: Diagnosis not present

## 2019-04-02 DIAGNOSIS — H0012 Chalazion right lower eyelid: Secondary | ICD-10-CM | POA: Diagnosis not present

## 2019-04-10 DIAGNOSIS — M7912 Myalgia of auxiliary muscles, head and neck: Secondary | ICD-10-CM | POA: Diagnosis not present

## 2019-04-10 DIAGNOSIS — Z885 Allergy status to narcotic agent status: Secondary | ICD-10-CM | POA: Diagnosis not present

## 2019-04-10 DIAGNOSIS — M25559 Pain in unspecified hip: Secondary | ICD-10-CM | POA: Diagnosis not present

## 2019-04-10 DIAGNOSIS — Z981 Arthrodesis status: Secondary | ICD-10-CM | POA: Diagnosis not present

## 2019-04-10 DIAGNOSIS — M545 Low back pain: Secondary | ICD-10-CM | POA: Diagnosis not present

## 2019-04-10 DIAGNOSIS — G8929 Other chronic pain: Secondary | ICD-10-CM | POA: Diagnosis not present

## 2019-04-10 DIAGNOSIS — M7918 Myalgia, other site: Secondary | ICD-10-CM | POA: Diagnosis not present

## 2019-04-10 DIAGNOSIS — Z888 Allergy status to other drugs, medicaments and biological substances status: Secondary | ICD-10-CM | POA: Diagnosis not present

## 2019-04-10 DIAGNOSIS — G8250 Quadriplegia, unspecified: Secondary | ICD-10-CM | POA: Diagnosis not present

## 2019-04-10 DIAGNOSIS — S12400S Unspecified displaced fracture of fifth cervical vertebra, sequela: Secondary | ICD-10-CM | POA: Diagnosis not present

## 2019-04-10 DIAGNOSIS — S14105S Unspecified injury at C5 level of cervical spinal cord, sequela: Secondary | ICD-10-CM | POA: Diagnosis not present

## 2019-04-10 DIAGNOSIS — M25511 Pain in right shoulder: Secondary | ICD-10-CM | POA: Diagnosis not present

## 2019-04-10 DIAGNOSIS — M25512 Pain in left shoulder: Secondary | ICD-10-CM | POA: Diagnosis not present

## 2019-04-10 DIAGNOSIS — Z91040 Latex allergy status: Secondary | ICD-10-CM | POA: Diagnosis not present

## 2019-04-14 DIAGNOSIS — R339 Retention of urine, unspecified: Secondary | ICD-10-CM | POA: Diagnosis not present

## 2019-04-14 DIAGNOSIS — L905 Scar conditions and fibrosis of skin: Secondary | ICD-10-CM | POA: Diagnosis not present

## 2019-04-15 ENCOUNTER — Telehealth: Payer: Self-pay

## 2019-04-15 DIAGNOSIS — J398 Other specified diseases of upper respiratory tract: Secondary | ICD-10-CM

## 2019-04-15 NOTE — Telephone Encounter (Signed)
Copied from CRM [id number]. Topic: Referral - Request for Referral >> Apr 15, 2019  9:57 AM Lynne Logan D wrote: Has patient seen PCP for this complaint? Yes *If NO, is insurance requiring patient see PCP for this issue before PCP can refer them? Referral for which specialty: Plastic Surgery Preferred provider/office: [facility] Dr. Gracy Bruins / [phone] Reason for referral: Tracheotomy Scar revision

## 2019-04-30 DIAGNOSIS — N390 Urinary tract infection, site not specified: Secondary | ICD-10-CM | POA: Diagnosis not present

## 2019-05-03 DIAGNOSIS — Z01812 Encounter for preprocedural laboratory examination: Secondary | ICD-10-CM | POA: Diagnosis not present

## 2019-05-03 DIAGNOSIS — Z20822 Contact with and (suspected) exposure to covid-19: Secondary | ICD-10-CM | POA: Diagnosis not present

## 2019-05-06 DIAGNOSIS — N318 Other neuromuscular dysfunction of bladder: Secondary | ICD-10-CM | POA: Diagnosis not present

## 2019-05-06 DIAGNOSIS — Z91040 Latex allergy status: Secondary | ICD-10-CM | POA: Diagnosis not present

## 2019-05-06 DIAGNOSIS — N319 Neuromuscular dysfunction of bladder, unspecified: Secondary | ICD-10-CM | POA: Diagnosis not present

## 2019-05-06 DIAGNOSIS — N3090 Cystitis, unspecified without hematuria: Secondary | ICD-10-CM | POA: Diagnosis not present

## 2019-05-06 DIAGNOSIS — Z885 Allergy status to narcotic agent status: Secondary | ICD-10-CM | POA: Diagnosis not present

## 2019-05-06 DIAGNOSIS — G8250 Quadriplegia, unspecified: Secondary | ICD-10-CM | POA: Diagnosis not present

## 2019-05-06 NOTE — Progress Notes (Deleted)
Subjective:   Cassandra Lowe is a 29 y.o. female who presents for Medicare Annual (Subsequent) preventive examination.    This visit is being conducted through telemedicine due to the COVID-19 pandemic. This patient has given me verbal consent via doximity to conduct this visit, patient states they are participating from their home address. Some vital signs may be absent or patient reported.    Patient identification: identified by name, DOB, and current address  Review of Systems:  N/A        Objective:     Vitals: There were no vitals taken for this visit.  There is no height or weight on file to calculate BMI. Unable to obtain vitals due to visit being conducted via telephonically.   Advanced Directives 04/30/2017 03/12/2017 07/24/2015  Does Patient Have a Medical Advance Directive? No No No  Would patient like information on creating a medical advance directive? Yes (MAU/Ambulatory/Procedural Areas - Information given) Yes (MAU/Ambulatory/Procedural Areas - Information given) No - patient declined information    Tobacco Social History   Tobacco Use  Smoking Status Never Smoker  Smokeless Tobacco Never Used     Counseling given: Not Answered   Clinical Intake:                       Past Medical History:  Diagnosis Date  . Burst fracture of cervical vertebra ([place])   . History of chicken pox   . Quadriplegia [facility])    Past Surgical History:  Procedure Laterality Date  . C4-C6 cervical fusion with Decompression or C5 burst fracture  02/12/2014  . MOUTH SURGERY     one tooth removed  . Reversal of Tracheostomy  03/22/2014  . TRACHEOSTOMY  02/26/2014   pallative tracheostomy for respiratory failure secondary to C-spine fracture and paraplegia   Family History  Problem Relation Age of Onset  . Ovarian cysts Mother   . Dysmenorrhea Mother   . Diabetes Mother        type 2; having complications  . Hypertension Mother   . Bipolar disorder Brother    . Schizophrenia Brother   . Hypertension Other   . Diabetes Other    Social History   Socioeconomic History  . Marital status: Single    Spouse name: Not on file  . Number of children: 0  . Years of education: Not on file  . Highest education level: Bachelor's degree (e.g., BA, AB, BS)  Occupational History  . Occupation: disabled  Tobacco Use  . Smoking status: Never Smoker  . Smokeless tobacco: Never Used  Substance and Sexual Activity  . Alcohol use: No    Alcohol/week: 0.0 standard drinks  . Drug use: No  . Sexual activity: Not on file    Comment: sexually active at 29 years of age; has had one sexual partner. Used condoms  Other Topics Concern  . Not on file  Social History Narrative  . Not on file   Social Determinants of Health   Financial Resource Strain:   . Difficulty of Paying Living Expenses:   Food Insecurity:   . Worried About Charity fundraiser in the Last Year:   . Arboriculturist in the Last Year:   Transportation Needs:   . Film/video editor (Medical):   Marland Kitchen Lack of Transportation (Non-Medical):   Physical Activity:   . Days of Exercise per Week:   . Minutes of Exercise per Session:   Stress:   .  Feeling of Stress :   Social Connections:   . Frequency of Communication with Friends and Family:   . Frequency of Social Gatherings with Friends and Family:   . Attends Religious Services:   . Active Member of Clubs or Organizations:   . Attends Banker Meetings:   Marland Kitchen Marital Status:     Outpatient Encounter Medications as of 05/07/2019  Medication Sig  . albuterol (PROVENTIL HFA;VENTOLIN HFA) 108 (90 Base) MCG/ACT inhaler Inhale 2 puffs into the lungs every 6 (six) hours as needed for wheezing or shortness of breath.  . bisacodyl (MAGIC BULLETS) 10 MG suppository Place 1 suppository (10 mg total) rectally daily as needed for mild constipation or moderate constipation.  . cephALEXin (KEFLEX) 500 MG capsule Take 500 mg by mouth  daily.  . cyproheptadine (PERIACTIN) 4 MG tablet Take 0.5-1 tablets (2-4 mg total) by mouth 4 (four) times daily as needed for allergies.  . Ginger, Zingiber officinalis, (GINGER PO) Take by mouth as needed.  . midodrine (PROAMATINE) 5 MG tablet TAKE 2 TABLETS(10 MG) BY MOUTH THREE TIMES DAILY AS NEEDED  . ondansetron (ZOFRAN) 4 MG tablet Take 1 tablet (4 mg total) by mouth every 8 (eight) hours as needed for nausea or vomiting.  Marland Kitchen oxybutynin (DITROPAN XL) 15 MG 24 hr tablet Take 15 mg by mouth 2 (two) times daily.  Marland Kitchen PARoxetine (PAXIL) 10 MG tablet Take 1 tablet (10 mg total) by mouth daily.  . protein supplement shake (PREMIER PROTEIN) LIQD Take 325 mLs (11 oz total) by mouth 3 (three) times daily between meals.   No facility-administered encounter medications on file as of 05/07/2019.    Activities of Daily Living No flowsheet data found.  Patient Care Team: Malva Limes, MD as PCP - General (Family Medicine)    Assessment:   This is a routine wellness examination for Cassandra Lowe.  Exercise Activities and Dietary recommendations    Goals    . DIET - INCREASE WATER INTAKE     Recommend increasing water intake to 4-6 glasses a day.        Fall Risk: Fall Risk  03/12/2017  Falls in the past year? Yes  Number falls in past yr: 1  Injury with Fall? No    FALL RISK PREVENTION PERTAINING TO THE HOME:  Any stairs in or around the home? {YES/NO:21197} If so, are there any without handrails? {YES/NO:21197}  Home free of loose throw rugs in walkways, pet beds, electrical cords, etc? Yes  Adequate lighting in your home to reduce risk of falls? Yes   ASSISTIVE DEVICES UTILIZED TO PREVENT FALLS:  Life alert? {YES/NO:21197} Use of a cane, walker or w/c? {YES/NO:21197} Grab bars in the bathroom? {YES/NO:21197} Shower chair or bench in shower? {YES/NO:21197} Elevated toilet seat or a handicapped toilet? {YES/NO:21197}   TIMED UP AND GO:  Was the test performed? No .     Depression Screen PHQ 2/9 Scores 04/30/2017 03/12/2017  PHQ - 2 Score 4 3  PHQ- 9 Score 17 13     Cognitive Function        Immunization History  Administered Date(s) Administered  . HPV Quadrivalent 10/20/2009, 01/18/2010, 05/25/2010  . Hepatitis A 10/20/2009, 05/25/2010  . Meningococcal Conjugate 10/20/2009  . Tdap 10/20/2009      Tdap: Up to date.  Flu Vaccine: Due for Flu vaccine. Does the patient want to receive this vaccine today?  {YES/NO:21197}. Advised may receive this vaccine at local pharmacy or Health Dept. Aware to provide  a copy of the vaccination record if obtained from local pharmacy or Health Dept. Verbalized acceptance and understanding.   Screening Tests Health Maintenance  Topic Date Due  . HIV Screening  Never done  . INFLUENZA VACCINE  Never done  . PAP SMEAR-Modifier  06/09/2019  . PAP-Cervical Cytology Screening  06/09/2019  . TETANUS/TDAP  10/21/2019    Cancer Screenings:   Additional Screening:  Vision Screening: Recommended annual ophthalmology exams for early detection of glaucoma and other disorders of the eye.  Dental Screening: Recommended annual dental exams for proper oral hygiene  Community Resource Referral:  CRR required this visit?  No       Plan:  I have personally reviewed and addressed the Medicare Annual Wellness questionnaire and have noted the following in the patient's chart:  A. Medical and social history B. Use of alcohol, tobacco or illicit drugs  C. Current medications and supplements D. Functional ability and status E.  Nutritional status F.  Physical activity G. Advance directives H. List of other physicians I.  Hospitalizations, surgeries, and ER visits in previous 12 months J.  Vitals K. Screenings such as hearing and vision if needed, cognitive and depression L. Referrals and appointments   In addition, I have reviewed and discussed with patient certain preventive protocols, quality metrics, and  best practice recommendations. A written personalized care plan for preventive services as well as general preventive health recommendations were provided to patient. Nurse Health Advisor  Signed,    Oiva Dibari [place], [place]  7/35/3299 Nurse Health Advisor   Nurse Notes: ***

## 2019-05-12 NOTE — Progress Notes (Signed)
Subjective:   Cassandra Lowe is a 29 y.o. female who presents for Medicare Annual (Subsequent) preventive examination.    This visit is being conducted through telemedicine due to the COVID-19 pandemic. This patient has given me verbal consent via doximity to conduct this visit, patient states they are participating from their home address. Some vital signs may be absent or patient reported.    Patient identification: identified by name, DOB, and current address  Review of Systems:  N/A  Cardiac Risk Factors include: none     Objective:     Vitals: There were no vitals taken for this visit.  There is no height or weight on file to calculate BMI. Unable to obtain vitals due to visit being conducted via telephonically.   Advanced Directives 05/13/2019 04/30/2017 03/12/2017 07/24/2015  Does Patient Have a Medical Advance Directive? Yes No No No  Type of Estate agent of [place];Living will - - -  Copy of Healthcare Power of Attorney in Chart? No - copy requested - - -  Would patient like information on creating a medical advance directive? - Yes (MAU/Ambulatory/Procedural Areas - Information given) Yes (MAU/Ambulatory/Procedural Areas - Information given) No - patient declined information    Tobacco Social History   Tobacco Use  Smoking Status Never Smoker  Smokeless Tobacco Never Used     Counseling given: Not Answered   Clinical Intake:  Pre-visit preparation completed: Yes  Pain : 0-10 Pain Score: 2  Pain Type: Chronic pain Pain Location: Neck(and shoulders) Pain Descriptors / Indicators: Aching Pain Frequency: Constant     Nutritional Risks: None Diabetes: No  How often do you need to have someone help you when you read instructions, pamphlets, or other written materials from your doctor or pharmacy?: 1 - Never  Interpreter Needed?: No  Information entered by :: [facility], LPN  Past Medical History:  Diagnosis Date  . Burst  fracture of cervical vertebra ([facility])   . History of chicken pox   . Quadriplegia [facility])    Past Surgical History:  Procedure Laterality Date  . Bladder botox    . C4-C6 cervical fusion with Decompression or C5 burst fracture  02/12/2014  . MOUTH SURGERY     one tooth removed  . Reversal of Tracheostomy  03/22/2014  . TRACHEOSTOMY  02/26/2014   pallative tracheostomy for respiratory failure secondary to C-spine fracture and paraplegia   Family History  Problem Relation Age of Onset  . Ovarian cysts Mother   . Dysmenorrhea Mother   . Diabetes Mother        type 2; having complications  . Hypertension Mother   . Bipolar disorder Brother   . Schizophrenia Brother   . Hypertension Other   . Diabetes Other    Social History   Socioeconomic History  . Marital status: Single    Spouse name: Not on file  . Number of children: 1  . Years of education: Not on file  . Highest education level: Bachelor's degree (e.g., BA, AB, BS)  Occupational History  . Occupation: disabled  Tobacco Use  . Smoking status: Never Smoker  . Smokeless tobacco: Never Used  Substance and Sexual Activity  . Alcohol use: No    Alcohol/week: 0.0 standard drinks  . Drug use: No  . Sexual activity: Not on file    Comment: sexually active at 29 years of age; has had one sexual partner. Used condoms  Other Topics Concern  . Not on file  Social History  Narrative  . Not on file   Social Determinants of Health   Financial Resource Strain: Low Risk   . Difficulty of Paying Living Expenses: Not hard at all  Food Insecurity: No Food Insecurity  . Worried About Programme researcher, broadcasting/film/video in the Last Year: Never true  . Ran Out of Food in the Last Year: Never true  Transportation Needs: No Transportation Needs  . Lack of Transportation (Medical): No  . Lack of Transportation (Non-Medical): No  Physical Activity: Inactive  . Days of Exercise per Week: 0 days  . Minutes of Exercise per Session: 0 min  Stress:  Stress Concern Present  . Feeling of Stress : To some extent  Social Connections: Slightly Isolated  . Frequency of Communication with Friends and Family: More than three times a week  . Frequency of Social Gatherings with Friends and Family: Once a week  . Attends Religious Services: Never  . Active Member of Clubs or Organizations: Yes  . Attends Banker Meetings: 1 to 4 times per year  . Marital Status: Living with partner    Outpatient Encounter Medications as of 05/13/2019  Medication Sig  . albuterol (PROVENTIL HFA;VENTOLIN HFA) 108 (90 Base) MCG/ACT inhaler Inhale 2 puffs into the lungs every 6 (six) hours as needed for wheezing or shortness of breath.  Marland Kitchen amoxicillin-clavulanate (AUGMENTIN) 875-125 MG tablet Take 1 tablet by mouth 2 (two) times daily.   . Ascorbic Acid (VITAMIN C) 100 MG tablet Take 100 mg by mouth daily. When remembers  . bisacodyl (MAGIC BULLETS) 10 MG suppository Place 1 suppository (10 mg total) rectally daily as needed for mild constipation or moderate constipation.  . cyclobenzaprine (FLEXERIL) 10 MG tablet Take 10 mg by mouth 3 (three) times daily as needed.   . Ginger, Zingiber officinalis, (GINGER PO) Take by mouth as needed.  . midodrine (PROAMATINE) 5 MG tablet TAKE 2 TABLETS(10 MG) BY MOUTH THREE TIMES DAILY AS NEEDED  . cephALEXin (KEFLEX) 500 MG capsule Take 500 mg by mouth daily.  . cyproheptadine (PERIACTIN) 4 MG tablet Take 0.5-1 tablets (2-4 mg total) by mouth 4 (four) times daily as needed for allergies. (Patient not taking: Reported on 05/13/2019)  . ondansetron (ZOFRAN) 4 MG tablet Take 1 tablet (4 mg total) by mouth every 8 (eight) hours as needed for nausea or vomiting. (Patient not taking: Reported on 05/13/2019)  . oxybutynin (DITROPAN XL) 15 MG 24 hr tablet Take 15 mg by mouth 2 (two) times daily.  Marland Kitchen PARoxetine (PAXIL) 10 MG tablet Take 1 tablet (10 mg total) by mouth daily. (Patient not taking: Reported on 05/13/2019)  . protein  supplement shake (PREMIER PROTEIN) LIQD Take 325 mLs (11 oz total) by mouth 3 (three) times daily between meals. (Patient not taking: Reported on 05/13/2019)   No facility-administered encounter medications on file as of 05/13/2019.    Activities of Daily Living In your present state of health, do you have any difficulty performing the following activities: 05/13/2019  Hearing? N  Vision? N  Difficulty concentrating or making decisions? N  Walking or climbing stairs? Y  Comment Due to being wheelchair bound.  Dressing or bathing? Y  Comment Has assistance from fiance.  Doing errands, shopping? Y  Comment Does not drive.  Preparing Food and eating ? Y  Using the Toilet? N  In the past six months, have you accidently leaked urine? Y  Comment Has to be catheterized. Recently had bladder botox.  Do you have problems with  loss of bowel control? N  Managing your Medications? N  Managing your Finances? N  Housekeeping or managing your Housekeeping? Y  Some recent data might be hidden    Patient Care Team: Malva Limes, MD as PCP - General (Family Medicine) Randalyn Rhea, MD as Referring Physician (Urology) Caro Hight, MD as Referring Physician (Physical Medicine and Rehabilitation)    Assessment:   This is a routine wellness examination for [place].  Exercise Activities and Dietary recommendations Current Exercise Habits: The patient does not participate in regular exercise at present, Exercise limited by: Other - see comments(Quadriplegic)  Goals    . DIET - INCREASE WATER INTAKE     Recommend increasing water intake to 4-6 glasses a day.        Fall Risk: Fall Risk  05/13/2019 03/12/2017  Falls in the past year? 0 Yes  Number falls in past yr: 0 1  Injury with Fall? 0 No    FALL RISK PREVENTION PERTAINING TO THE HOME:  Any stairs in or around the home? No If so, are there any without handrails? N/A  Home free of loose throw rugs in walkways, pet beds,  electrical cords, etc? Yes  Adequate lighting in your home to reduce risk of falls? Yes   ASSISTIVE DEVICES UTILIZED TO PREVENT FALLS:  Life alert? No  Use of a cane, walker or w/c? Yes  Grab bars in the bathroom? No  Shower chair or bench in shower? No  Elevated toilet seat or a handicapped toilet? Yes    TIMED UP AND GO:  Was the test performed? No .    Depression Screen PHQ 2/9 Scores 05/13/2019 04/30/2017 03/12/2017  PHQ - 2 Score 0 4 3  PHQ- 9 Score - 17 13     Cognitive Function     6CIT Screen 05/13/2019  What Year? 0 points  What month? 0 points  What time? 0 points  Count back from 20 0 points  Months in reverse 0 points  Repeat phrase 0 points  Total Score 0    Immunization History  Administered Date(s) Administered  . HPV Quadrivalent 10/20/2009, 01/18/2010, 05/25/2010  . Hepatitis A 10/20/2009, 05/25/2010  . Meningococcal Conjugate 10/20/2009  . Tdap 10/20/2009     Tdap: Up to date  Flu Vaccine: Pt declines vaccine.   Screening Tests Health Maintenance  Topic Date Due  . HIV Screening  Never done  . PAP SMEAR-Modifier  06/09/2019  . PAP-Cervical Cytology Screening  06/09/2019  . INFLUENZA VACCINE  09/07/2019  . TETANUS/TDAP  10/21/2019    Cancer Screenings: N/A  Additional Screening:  Vision Screening: Recommended annual ophthalmology exams for early detection of glaucoma and other disorders of the eye.  Dental Screening: Recommended annual dental exams for proper oral hygiene  Community Resource Referral:  CRR required this visit?  No       Plan:  I have personally reviewed and addressed the Medicare Annual Wellness questionnaire and have noted the following in the patient's chart:  A. Medical and social history B. Use of alcohol, tobacco or illicit drugs  C. Current medications and supplements D. Functional ability and status E.  Nutritional status F.  Physical activity G. Advance directives H. List of other physicians I.    Hospitalizations, surgeries, and ER visits in previous 12 months J.  Vitals K. Screenings such as hearing and vision if needed, cognitive and depression L. Referrals and appointments   In addition, I have reviewed and discussed with  patient certain preventive protocols, quality metrics, and best practice recommendations. A written personalized care plan for preventive services as well as general preventive health recommendations were provided to patient. Nurse Health Advisor  Signed,    Mayte Diers [place], [place]  03/13/2710 Nurse Health Advisor   Nurse Notes: Pap smear and HIV lab due. Pt to check with OBGYN first as she may complete these tasks with them.

## 2019-05-13 ENCOUNTER — Ambulatory Visit (INDEPENDENT_AMBULATORY_CARE_PROVIDER_SITE_OTHER): Payer: Medicare Other

## 2019-05-13 ENCOUNTER — Other Ambulatory Visit: Payer: Self-pay

## 2019-05-13 DIAGNOSIS — Z0000 Encounter for general adult medical examination without abnormal findings: Secondary | ICD-10-CM | POA: Diagnosis not present

## 2019-05-13 NOTE — Patient Instructions (Signed)
Cassandra Lowe , Thank you for taking time to come for your Medicare Wellness Visit. I appreciate your ongoing commitment to your health goals. Please review the following plan we discussed and let me know if I can assist you in the future.   Screening recommendations/referrals: Colonoscopy: Not required.  Mammogram: Not required.  Bone Density: Not required.  Recommended yearly ophthalmology/optometry visit for glaucoma screening and checkup Recommended yearly dental visit for hygiene and checkup  Vaccinations: Influenza vaccine: Pt declines today.  Tdap vaccine: Up to date   Advanced directives: Please bring a copy of your POA (Power of [place]) and/or Living Will to your next appointment.   Conditions/risks identified: Recommend to increase water intake to 6-8 8 oz glasses a day.  Next appointment: 07/22/19 @ 2:00 PM with Dr Caryn Section. Declined scheduling an AWV for 2022.  Preventive Care 40-64 Years, Female Preventive care refers to lifestyle choices and visits with your health care provider that can promote health and wellness. What does preventive care include?  A yearly physical exam. This is also called an annual well check.  Dental exams once or twice a year.  Routine eye exams. Ask your health care provider how often you should have your eyes checked.  Personal lifestyle choices, including:  Daily care of your teeth and gums.  Regular physical activity.  Eating a healthy diet.  Avoiding tobacco and drug use.  Limiting alcohol use.  Practicing safe sex.  Taking low-dose aspirin daily starting at age 90.  Taking vitamin and mineral supplements as recommended by your health care provider. What happens during an annual well check? The services and screenings done by your health care provider during your annual well check will depend on your age, overall health, lifestyle risk factors, and family history of disease. Counseling  Your health care provider may ask you  questions about your:  Alcohol use.  Tobacco use.  Drug use.  Emotional well-being.  Home and relationship well-being.  Sexual activity.  Eating habits.  Work and work Statistician.  Method of birth control.  Menstrual cycle.  Pregnancy history. Screening  You may have the following tests or measurements:  Height, weight, and BMI.  Blood pressure.  Lipid and cholesterol levels. These may be checked every 5 years, or more frequently if you are over 22 years old.  Skin check.  Lung cancer screening. You may have this screening every year starting at age 63 if you have a 30-pack-year history of smoking and currently smoke or have quit within the past 15 years.  Fecal occult blood test (FOBT) of the stool. You may have this test every year starting at age 16.  Flexible sigmoidoscopy or colonoscopy. You may have a sigmoidoscopy every 5 years or a colonoscopy every 10 years starting at age 72.  Hepatitis C blood test.  Hepatitis B blood test.  Sexually transmitted disease (STD) testing.  Diabetes screening. This is done by checking your blood sugar (glucose) after you have not eaten for a while (fasting). You may have this done every 1-3 years.  Mammogram. This may be done every 1-2 years. Talk to your health care provider about when you should start having regular mammograms. This may depend on whether you have a family history of breast cancer.  BRCA-related cancer screening. This may be done if you have a family history of breast, ovarian, tubal, or peritoneal cancers.  Pelvic exam and Pap test. This may be done every 3 years starting at age 62. Starting at age  30, this may be done every 5 years if you have a Pap test in combination with an HPV test.  Bone density scan. This is done to screen for osteoporosis. You may have this scan if you are at high risk for osteoporosis. Discuss your test results, treatment options, and if necessary, the need for more tests with  your health care provider. Vaccines  Your health care provider may recommend certain vaccines, such as:  Influenza vaccine. This is recommended every year.  Tetanus, diphtheria, and acellular pertussis (Tdap, Td) vaccine. You may need a Td booster every 10 years.  Zoster vaccine. You may need this after age 36.  Pneumococcal 13-valent conjugate (PCV13) vaccine. You may need this if you have certain conditions and were not previously vaccinated.  Pneumococcal polysaccharide (PPSV23) vaccine. You may need one or two doses if you smoke cigarettes or if you have certain conditions. Talk to your health care provider about which screenings and vaccines you need and how often you need them. This information is not intended to replace advice given to you by your health care provider. Make sure you discuss any questions you have with your health care provider. Document Released: 02/19/2015 Document Revised: 10/13/2015 Document Reviewed: 11/24/2014 Elsevier Interactive Patient Education  2017 [place] Prevention in the Home Falls can cause injuries. They can happen to people of all ages. There are many things you can do to make your home safe and to help prevent falls. What can I do on the outside of my home?  Regularly fix the edges of walkways and driveways and fix any cracks.  Remove anything that might make you trip as you walk through a door, such as a raised step or threshold.  Trim any bushes or trees on the path to your home.  Use bright outdoor lighting.  Clear any walking paths of anything that might make someone trip, such as rocks or tools.  Regularly check to see if handrails are loose or broken. Make sure that both sides of any steps have handrails.  Any raised decks and porches should have guardrails on the edges.  Have any leaves, snow, or ice cleared regularly.  Use sand or salt on walking paths during winter.  Clean up any spills in your garage right  away. This includes oil or grease spills. What can I do in the bathroom?  Use night lights.  Install grab bars by the toilet and in the tub and shower. Do not use towel bars as grab bars.  Use non-skid mats or decals in the tub or shower.  If you need to sit down in the shower, use a plastic, non-slip stool.  Keep the floor dry. Clean up any water that spills on the floor as soon as it happens.  Remove soap buildup in the tub or shower regularly.  Attach bath mats securely with double-sided non-slip rug tape.  Do not have throw rugs and other things on the floor that can make you trip. What can I do in the bedroom?  Use night lights.  Make sure that you have a light by your bed that is easy to reach.  Do not use any sheets or blankets that are too big for your bed. They should not hang down onto the floor.  Have a firm chair that has side arms. You can use this for support while you get dressed.  Do not have throw rugs and other things on the floor that can  make you trip. What can I do in the kitchen?  Clean up any spills right away.  Avoid walking on wet floors.  Keep items that you use a lot in easy-to-reach places.  If you need to reach something above you, use a strong step stool that has a grab bar.  Keep electrical cords out of the way.  Do not use floor polish or wax that makes floors slippery. If you must use wax, use non-skid floor wax.  Do not have throw rugs and other things on the floor that can make you trip. What can I do with my stairs?  Do not leave any items on the stairs.  Make sure that there are handrails on both sides of the stairs and use them. Fix handrails that are broken or loose. Make sure that handrails are as long as the stairways.  Check any carpeting to make sure that it is firmly attached to the stairs. Fix any carpet that is loose or worn.  Avoid having throw rugs at the top or bottom of the stairs. If you do have throw rugs, attach  them to the floor with carpet tape.  Make sure that you have a light switch at the top of the stairs and the bottom of the stairs. If you do not have them, ask someone to add them for you. What else can I do to help prevent falls?  Wear shoes that:  Do not have high heels.  Have rubber bottoms.  Are comfortable and fit you well.  Are closed at the toe. Do not wear sandals.  If you use a stepladder:  Make sure that it is fully opened. Do not climb a closed stepladder.  Make sure that both sides of the stepladder are locked into place.  Ask someone to hold it for you, if possible.  Clearly mark and make sure that you can see:  Any grab bars or handrails.  First and last steps.  Where the edge of each step is.  Use tools that help you move around (mobility aids) if they are needed. These include:  Canes.  Walkers.  Scooters.  Crutches.  Turn on the lights when you go into a dark area. Replace any light bulbs as soon as they burn out.  Set up your furniture so you have a clear path. Avoid moving your furniture around.  If any of your floors are uneven, fix them.  If there are any pets around you, be aware of where they are.  Review your medicines with your doctor. Some medicines can make you feel dizzy. This can increase your chance of falling. Ask your doctor what other things that you can do to help prevent falls. This information is not intended to replace advice given to you by your health care provider. Make sure you discuss any questions you have with your health care provider. Document Released: 11/19/2008 Document Revised: 07/01/2015 Document Reviewed: 02/27/2014 Elsevier Interactive Patient Education  2017 [employer].

## 2019-05-14 DIAGNOSIS — J398 Other specified diseases of upper respiratory tract: Secondary | ICD-10-CM | POA: Diagnosis not present

## 2019-05-15 DIAGNOSIS — R252 Cramp and spasm: Secondary | ICD-10-CM | POA: Diagnosis not present

## 2019-05-15 DIAGNOSIS — S14105S Unspecified injury at C5 level of cervical spinal cord, sequela: Secondary | ICD-10-CM | POA: Diagnosis not present

## 2019-05-30 DIAGNOSIS — R339 Retention of urine, unspecified: Secondary | ICD-10-CM | POA: Diagnosis not present

## 2019-06-11 ENCOUNTER — Telehealth: Payer: Self-pay

## 2019-06-11 NOTE — Telephone Encounter (Signed)
Cassandra Lowe is doing faxes but I looked at Dr. Theodis Aguas box where we put incoming faxes and it's in there. TNP

## 2019-06-11 NOTE — Telephone Encounter (Signed)
Fax is on Dr. Theodis Aguas desk to be signed.

## 2019-06-11 NOTE — Telephone Encounter (Signed)
Copied from CRM [id number]. Topic: General - Other >> Jun 11, 2019 10:37 AM Wyonia Hough E wrote: Reason for CRM Ann Maki sent over paperwork for home modifications/ Pt needs modifications to her home and the order was faxed to the office but the office faxed her the paperwork right back/ she faxed it again this morning/ please advise  She can be reached at [phone]/ she is the Pts CAP/C case manager from [facility] health services

## 2019-06-18 ENCOUNTER — Telehealth: Payer: Self-pay

## 2019-06-18 DIAGNOSIS — Z111 Encounter for screening for respiratory tuberculosis: Secondary | ICD-10-CM

## 2019-06-18 NOTE — Telephone Encounter (Signed)
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 She needs a TB test along with a copy of her immunization record. Her last Tdap was 10/20/2009. Patient would prefer to not get this vaccine right now. She says it technically hasn't been a full 10 years from her last tetanus shot. She wants to know if we can do a titre for tetanus to see if she is still immune rather than getting a booster shot. Please advise if ok to order TB test and whether patient should have booster tetanus vaccine or titre. She has to submit this information to her school before 07/08/2019.

## 2019-06-18 NOTE — Telephone Encounter (Signed)
OK to order TB gold test. Titers for tetanus are not used to determine immunity. Needs to get Td 10 years, so she doesn't need it right now anyway. She is still up to date

## 2019-06-18 NOTE — Telephone Encounter (Signed)
Copied from CRM [id number]. Topic: General - Other >> Jun 18, 2019  1:04 PM Dalphine Handing A wrote: Patient would like a callback from nurse in regards to having orders placed for tb test

## 2019-06-19 NOTE — Telephone Encounter (Signed)
Lab ordered.

## 2019-06-23 DIAGNOSIS — W1789XS Other fall from one level to another, sequela: Secondary | ICD-10-CM | POA: Diagnosis not present

## 2019-06-23 DIAGNOSIS — S14105S Unspecified injury at C5 level of cervical spinal cord, sequela: Secondary | ICD-10-CM | POA: Diagnosis not present

## 2019-06-23 DIAGNOSIS — Z87440 Personal history of urinary (tract) infections: Secondary | ICD-10-CM | POA: Diagnosis not present

## 2019-06-23 DIAGNOSIS — N319 Neuromuscular dysfunction of bladder, unspecified: Secondary | ICD-10-CM | POA: Diagnosis not present

## 2019-06-24 NOTE — Telephone Encounter (Signed)
Pt calling back to advise she also needs proof the following :  MMR titer Varicella titer hepatitis B titer Pt states she had this done around 2014 and Dr Caryn Section did at that time.  Pt also has form she needs filled out.  Pt has made appt 07/04/19 at 8:20 am.

## 2019-06-30 LAB — QUANTIFERON-TB GOLD PLUS
QuantiFERON Mitogen Value: >10 IU/mL
QuantiFERON Nil Value: 0.02 IU/mL
QuantiFERON TB1 Ag Value: 0.07 IU/mL
QuantiFERON TB2 Ag Value: 0.05 IU/mL
QuantiFERON-TB Gold Plus: NEGATIVE

## 2019-07-01 DIAGNOSIS — R339 Retention of urine, unspecified: Secondary | ICD-10-CM | POA: Diagnosis not present

## 2019-07-02 ENCOUNTER — Telehealth: Payer: Self-pay

## 2019-07-02 DIAGNOSIS — Z0184 Encounter for antibody response examination: Secondary | ICD-10-CM

## 2019-07-02 NOTE — Telephone Encounter (Signed)
Defer to PCP if he'd like to order labs prior.

## 2019-07-02 NOTE — Progress Notes (Signed)
Established patient visit   Patient: Cassandra Lowe   DOB: 03/29/1990   29 y.o. Female  MRN: [id number] Visit Date: 07/04/2019  Today's healthcare provider: Lelon Huh, MD   Chief Complaint  Patient presents with  . Physical exam for school   Subjective    HPI Patient is here today to have paperwork completed. 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 She was going to [place] and states she was a few weeks from graduation when she was paralyzed about 6 years ago and had to drop out. 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 He also needs proof of immunizations and had titers drawn yesterday which are pending. She has no complaints today.      Medications: Outpatient Medications Prior to Visit  Medication Sig  . albuterol (PROVENTIL HFA;VENTOLIN HFA) 108 (90 Base) MCG/ACT inhaler Inhale 2 puffs into the lungs every 6 (six) hours as needed for wheezing or shortness of breath.  . Ascorbic Acid (VITAMIN C) 100 MG tablet Take 100 mg by mouth daily. When remembers  . bisacodyl (MAGIC BULLETS) 10 MG suppository Place 1 suppository (10 mg total) rectally daily as needed for mild constipation or moderate constipation.  . cephALEXin (KEFLEX) 500 MG capsule Take 500 mg by mouth daily.  . cyclobenzaprine (FLEXERIL) 10 MG tablet Take 10 mg by mouth 3 (three) times daily as needed.   . cyproheptadine (PERIACTIN) 4 MG tablet Take 0.5-1 tablets (2-4 mg total) by mouth 4 (four) times daily as needed for allergies. (Patient not taking: Reported on 05/13/2019)  . Ginger, Zingiber officinalis, (GINGER PO) Take by mouth as needed.  . midodrine (PROAMATINE) 5 MG tablet TAKE 2 TABLETS(10 MG) BY MOUTH THREE TIMES DAILY AS NEEDED  . ondansetron (ZOFRAN) 4 MG tablet Take 1 tablet (4 mg total) by mouth every 8 (eight) hours as needed for  nausea or vomiting. (Patient not taking: Reported on 05/13/2019)  . oxybutynin (DITROPAN XL) 15 MG 24 hr tablet Take 15 mg by mouth 2 (two) times daily.  Marland Kitchen PARoxetine (PAXIL) 10 MG tablet Take 1 tablet (10 mg total) by mouth daily. (Patient not taking: Reported on 05/13/2019)  . protein supplement shake (PREMIER PROTEIN) LIQD Take 325 mLs (11 oz total) by mouth 3 (three) times daily between meals. (Patient not taking: Reported on 05/13/2019)   No facility-administered medications prior to visit.    Review of Systems  Constitutional: Negative for appetite change, chills, fatigue and fever.  Respiratory: Negative for chest tightness and shortness of breath.   Cardiovascular: Negative for chest pain and palpitations.  Gastrointestinal: Negative for abdominal pain, nausea and vomiting.  Neurological: Negative for dizziness and weakness.     Objective    BP 128/87 (BP Location: Right Arm, Patient Position: Sitting, Cuff Size: Normal)   Pulse 79   Ht 5' 4.5" (1.638 m)   BMI 14.87 kg/m   Physical Exam   General Appearance:    Well developed, well nourished female. Sitting comfortably in wheelchair. Alert, cooperative, in no acute distress, appears stated age   Head:    Normocephalic, without obvious abnormality, atraumatic  Eyes:    PERRL, conjunctiva/corneas clear, EOM's intact, fundi    benign, both eyes  Ears:    Normal TM's and external ear canals, both ears  Neck:  Supple, symmetrical, trachea midline, no adenopathy;    thyroid:  no enlargement/tenderness/nodules; no carotid   bruit or JVD  Back:     Symmetric, no curvature, ROM normal, no CVA tenderness  Lungs:     Clear to auscultation bilaterally, respirations unlabored  Chest Wall:    No tenderness or deformity   Heart:    Normal heart rate. Normal rhythm. No murmurs, rubs, or gallops.   Breast Exam:    deferred  Abdomen:     Soft, non-tender, bowel sounds active all four quadrants,    no masses, no organomegaly  Pelvic:     deferred  Extremities:   All extremities are intact. No cyanosis or edema  Pulses:   2+ and symmetric all extremities  Skin:   Skin color, texture, turgor normal, no rashes or lesions  Neurologic:   No movement or sensation of either lower extremity. Mildly contracted fingers and wrists with increase tones. Normal ROM, strength and tone of shoulders and upper arms.      No results found for any visits on 07/04/19.  Assessment & Plan     1. Encounter for other administrative examinations Exam for [employer] nursing school admission. 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 Immunization titers pending.   2. Quadriplegia (HCC) Coping well. Has an 75 month old girl and returning to nursing school as above.    No follow-ups on file.      The entirety of the information documented in the History of Present Illness, Review of Systems and Physical Exam were personally obtained by me. Portions of this information were initially documented by the CMA and reviewed by me for thoroughness and accuracy.      Mila Merry, MD  [facility] [phone] (phone) [phone] (fax)  [facility]

## 2019-07-02 NOTE — Telephone Encounter (Signed)
She can have the Tdap titers if she likes, but there is a good chance she will not be immune since its been 10 years since her last tetanus shot.... in which case she would have to the get the Tdap vaccine anyway.

## 2019-07-02 NOTE — Telephone Encounter (Signed)
Copied from CRM [id number]. 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  Call back [phone]

## 2019-07-02 NOTE — Telephone Encounter (Signed)
Tried calling patient. Left message to call back. OK for [facility] triage to discuss message from Dr. Sherrie Mustache with patient. Also advise patient that if she decides to do the TDAP titer the turn around time for a result may be up to 6 days.

## 2019-07-02 NOTE — Telephone Encounter (Signed)
Patient returned call and was advised of the message below by Dr. Sherrie Mustache and that if she chooses TDAP titer it could take up to 6 days for results. She asked about the Varicella Titer and says she's wants to get both. She asked if she could get them both tomorrow. I advised I will send this to the office and she will receive a call back with the recommendation.

## 2019-07-02 NOTE — Telephone Encounter (Signed)
Patient has an appointment this Friday 07/04/2019 to have form filled out for school. She would like to have a titers done for TDAP and Varicella. She never had a varicella vaccine, but states she had chickenpox in the past. She states she has also had at least 4 tetanus shots in her lifetime. She called [facility] and was told that they have a titer test that checks for immunity for both TDAP and varicella. Patient request that orders be placed so that she can come in tomorrow to have blood work done before her appointment on Friday. Patient states that her form has to be turned in on Monday.   Patient didn't know the test name or numbers to order in [facility], but I found orders that may be what she is looking for. Please advise if ok to order and which dx code to associate then with. The turn around time for the Tdap test could be up to 6 days. Patient really wants to have labs done tomorrow, so she asked if another provider could review this message since Dr. Sherrie Mustache is out of the office until Friday.   Tetanus/Diphtheria Antibody Profile TEST: [id number]  Varicella Zoster Virus (VZV) Antibodies, IgG TEST: [id number]

## 2019-07-03 DIAGNOSIS — Z0184 Encounter for antibody response examination: Secondary | ICD-10-CM | POA: Diagnosis not present

## 2019-07-03 NOTE — Telephone Encounter (Signed)
Patient verbalized understanding of the turn around time and wants to proceed with having the titers for TDAP and Varicella. Lab order printed and placed up front in [address].

## 2019-07-04 ENCOUNTER — Other Ambulatory Visit: Payer: Self-pay

## 2019-07-04 ENCOUNTER — Ambulatory Visit (INDEPENDENT_AMBULATORY_CARE_PROVIDER_SITE_OTHER): Payer: Medicare Other | Admitting: Family Medicine

## 2019-07-04 ENCOUNTER — Encounter: Payer: Self-pay | Admitting: Family Medicine

## 2019-07-04 VITALS — BP 128/87 | HR 79 | Ht 64.5 in

## 2019-07-04 DIAGNOSIS — S129XXS Fracture of neck, unspecified, sequela: Secondary | ICD-10-CM

## 2019-07-04 DIAGNOSIS — G8250 Quadriplegia, unspecified: Secondary | ICD-10-CM

## 2019-07-04 DIAGNOSIS — Z0289 Encounter for other administrative examinations: Secondary | ICD-10-CM | POA: Diagnosis not present

## 2019-07-04 NOTE — Patient Instructions (Signed)
.   Please review the attached list of medications and notify my office if there are any errors.   . Please bring all of your medications to every appointment so we can make sure that our medication list is the same as yours.   

## 2019-07-05 LAB — VARICELLA ZOSTER ANTIBODY, IGG: Varicella zoster IgG: 170 index (ref >165)

## 2019-07-05 LAB — DIPHTHERIA / TETANUS ANTIBODY PANEL
Diphtheria Ab: 1.95 IU/mL (ref <0.10)
Tetanus Ab, IgG: 1.18 IU/mL (ref <0.10)

## 2019-07-05 LAB — BORDETELLA PERTUSSIS ANTIBODY
B pertussis IgA Ab, Quant: <1 index (ref 0.0–0.9)
B pertussis IgG Ab: 1.75 index — ABNORMAL HIGH (ref 0.00–0.94)
B pertussis IgM Ab, Quant: 1 index — ABNORMAL HIGH (ref 0.0–0.9)

## 2019-07-08 ENCOUNTER — Ambulatory Visit: Payer: Self-pay

## 2019-07-08 ENCOUNTER — Other Ambulatory Visit: Payer: Self-pay

## 2019-07-08 ENCOUNTER — Encounter: Payer: Self-pay | Admitting: Family Medicine

## 2019-07-08 ENCOUNTER — Ambulatory Visit (INDEPENDENT_AMBULATORY_CARE_PROVIDER_SITE_OTHER): Payer: Medicare Other | Admitting: Family Medicine

## 2019-07-08 DIAGNOSIS — G8250 Quadriplegia, unspecified: Secondary | ICD-10-CM

## 2019-07-08 DIAGNOSIS — R197 Diarrhea, unspecified: Secondary | ICD-10-CM | POA: Diagnosis not present

## 2019-07-08 DIAGNOSIS — R110 Nausea: Secondary | ICD-10-CM | POA: Diagnosis not present

## 2019-07-08 MED ORDER — ONDANSETRON HCL 4 MG PO TABS: 4.0000 mg | ORAL_TABLET | Freq: Three times a day (TID) | ORAL | 0 refills | Status: DC | PRN

## 2019-07-08 NOTE — Telephone Encounter (Signed)
Returned call to patient who was seeking advice.  She has diarrhea that she feel is from eating a taco from a taco truck three days ago. Her symptoms started 24 hours after. She states she has had 5 or more watery stools per day.yesterday  was worse than today. She is quadraplegic and dose do bowel protocol. She has used pepto bismol. She states that she feels she is drinking adequate amount of liquid about 16 0z. In 24 hours. She states her lip are a bit dry. No other symptoms of dehydration. She is seeking other OTC meds and advice for treatment. She also ask about labs she had done for school.  She states she needs them today. Care advice was read to patient . She verbalized understanding. Appointment scheduled virtual today.DT completed.  Reason for Disposition . [1] SEVERE diarrhea (e.g., 7 or more times / day more than normal) AND [2] present > 24 hours (1 day)  Answer Assessment - Initial Assessment Questions 1. DIARRHEA SEVERITY: "How bad is the diarrhea?" "How many extra stools have you had in the past 24 hours than normal?"    - NO DIARRHEA (SCALE 0)   - MILD (SCALE 1-3): Few loose or mushy BMs; increase of 1-3 stools over normal daily number of stools; mild increase in ostomy output.   -  MODERATE (SCALE 4-7): Increase of 4-6 stools daily over normal; moderate increase in ostomy output. * SEVERE (SCALE 8-10; OR 'WORST POSSIBLE'): Increase of 7 or more stools daily over normal; moderate increase in ostomy output; incontinence.     5 times every few hours 2. ONSET: "When did the diarrhea begin?"      3 days ago onset was next day 3. BM CONSISTENCY: "How loose or watery is the diarrhea?"      watery 4. VOMITING: "Are you also vomiting?" If so, ask: "How many times in the past 24 hours?"      No  5. ABDOMINAL PAIN: "Are you having any abdominal pain?" If yes: "What does it feel like?" (e.g., crampy, dull, intermittent, constant)      Stopped today  6. ABDOMINAL PAIN SEVERITY: If present,  ask: "How bad is the pain?"  (e.g., Scale 1-10; mild, moderate, or severe)   - MILD (1-3): doesn't interfere with normal activities, abdomen soft and not tender to touch    - MODERATE (4-7): interferes with normal activities or awakens from sleep, tender to touch    - SEVERE (8-10): excruciating pain, doubled over, unable to do any normal activities      Mild 7. ORAL INTAKE: If vomiting, "Have you been able to drink liquids?" "How much fluids have you had in the past 24 hours?"     18 oz 8. HYDRATION: "Any signs of dehydration?" (e.g., dry mouth [not just dry lips], too weak to stand, dizziness, new weight loss) "When did you last urinate?"    No lips are dry 9. EXPOSURE: "Have you traveled to a foreign country recently?" "Have you been exposed to anyone with diarrhea?" "Could you have eaten any food that was spoiled?"     Taco from food truck 10. ANTIBIOTIC USE: "Are you taking antibiotics now or have you taken antibiotics in the past 2 months?"      chronicUTI 11. OTHER SYMPTOMS: "Do you have any other symptoms?" (e.g., fever, blood in stool)       no 12. PREGNANCY: "Is there any chance you are pregnant?" "When was your last menstrual period?"  No period 2 days ago  Protocols used: DIARRHEA-A-AH

## 2019-07-08 NOTE — Telephone Encounter (Signed)
Pt calling back to check status of lab results and requests callback [phone]

## 2019-07-08 NOTE — Progress Notes (Signed)
Virtual telephone visit    Virtual Visit via Telephone Note   This visit type was conducted due to national recommendations for restrictions regarding the COVID-19 Pandemic (e.g. social distancing) in an effort to limit this patient's exposure and mitigate transmission in our community. Due to her co-morbid illnesses, this patient is at least at moderate risk for complications without adequate follow up. This format is felt to be most appropriate for this patient at this time. The patient did not have access to video technology or had technical difficulties with video requiring transitioning to audio format only (telephone). Physical exam was limited to content and character of the telephone converstion.    Patient location: home Provider location: office   Visit Date: 07/08/2019  Today's healthcare provider: Vernie Murders, PA   Chief Complaint  Patient presents with  . Diarrhea   Subjective    HPI  Patient presents today via phone visit for evaluation of diarrhea.  She states she at first thought it was due to eating food truck [employer] last week.  The day after eating them she began having abdominal pain, nausea and diarrhea. She is still having the diarrhea and nausea despite taking Pepto Bismol and being on the [employer].  She does not believe she has had any fever.   The patient is quadraplegic and feels this may slow her system and cause the symptoms to be lasting longer than they normally would last.      Past Medical History:  Diagnosis Date  . Burst fracture of cervical vertebra ([place])   . History of chicken pox   . Quadriplegia [facility] ([place]    Past Surgical History:  Procedure Laterality Date  . Bladder botox    . C4-C6 cervical fusion with Decompression or C5 burst fracture  02/12/2014  . MOUTH SURGERY     one tooth removed  . Reversal of Tracheostomy  03/22/2014  . TRACHEOSTOMY  02/26/2014   pallative tracheostomy for respiratory failure secondary to C-spine  fracture and paraplegia   Social History   Tobacco Use  . Smoking status: Never Smoker  . Smokeless tobacco: Never Used  Substance Use Topics  . Alcohol use: No    Alcohol/week: 0.0 standard drinks  . Drug use: No   Family Status  Relation Name Status  . Mother  Alive  . Father  Alive  . Sister  Alive  . Brother  Alive  . Other Grandfather Alive  . Other Grandmother Alive   Allergies  Allergen Reactions  . Baclofen     Memory loss  . Morphine And Related Nausea Only  . Oxycodone Itching  . Tape     Other reaction(s): Other (See Comments) "Skin burns" Skin breakdown  . Latex     Other reaction(s): Other (See Comments) "Skin burns" Skin breakdown Other reaction(s): Other (See Comments) blisters  . Tramadol Itching    Patient can take if she takes a benadryl with it. Patient can take if she takes a benadryl with it.      Medications: Outpatient Medications Prior to Visit  Medication Sig  . albuterol (PROVENTIL HFA;VENTOLIN HFA) 108 (90 Base) MCG/ACT inhaler Inhale 2 puffs into the lungs every 6 (six) hours as needed for wheezing or shortness of breath.  . Ascorbic Acid (VITAMIN C) 100 MG tablet Take 100 mg by mouth daily. When remembers  . bisacodyl (MAGIC BULLETS) 10 MG suppository Place 1 suppository (10 mg total) rectally daily as needed for mild constipation or moderate constipation.  Marland Kitchen  cyclobenzaprine (FLEXERIL) 10 MG tablet Take 10 mg by mouth 3 (three) times daily as needed.   . Ginger, Zingiber officinalis, (GINGER PO) Take by mouth as needed.  . midodrine (PROAMATINE) 5 MG tablet TAKE 2 TABLETS(10 MG) BY MOUTH THREE TIMES DAILY AS NEEDED  . ondansetron (ZOFRAN) 4 MG tablet Take 1 tablet (4 mg total) by mouth every 8 (eight) hours as needed for nausea or vomiting. (Patient not taking: Reported on 05/13/2019)  . oxybutynin (DITROPAN XL) 15 MG 24 hr tablet Take 15 mg by mouth 2 (two) times daily.  . [DISCONTINUED] cephALEXin (KEFLEX) 500 MG capsule Take 500 mg by  mouth daily.  . [DISCONTINUED] cyproheptadine (PERIACTIN) 4 MG tablet Take 0.5-1 tablets (2-4 mg total) by mouth 4 (four) times daily as needed for allergies. (Patient not taking: Reported on 05/13/2019)  . [DISCONTINUED] PARoxetine (PAXIL) 10 MG tablet Take 1 tablet (10 mg total) by mouth daily. (Patient not taking: Reported on 05/13/2019)  . [DISCONTINUED] protein supplement shake (PREMIER PROTEIN) LIQD Take 325 mLs (11 oz total) by mouth 3 (three) times daily between meals. (Patient not taking: Reported on 05/13/2019)   No facility-administered medications prior to visit.    Review of Systems  Constitutional: Negative for chills and fever.  Gastrointestinal: Positive for diarrhea and nausea. Negative for abdominal pain, constipation and vomiting.      Objective    There were no vitals taken for this visit.  Physical: No apparent respiratory distress during telephonic interview.   Assessment & Plan     1. Diarrhea, unspecified type Had frequent diarrhea over the past weekend after eating some tacos. No blood in stools or abdominal pains. May use Pepto-Bismol with a bland diet and increase in fluid intake. Feels the diarrhea has improved greatly since yesterday. Continue BRAT diet and may add a Probiotic. Call to report any worsening or persistence in 3 days.  2. Nausea Some nausea without vomiting over the weekend with the diarrhea from suspected food poisoning. Usually uses Ginger to help control any nausea but it may cause the diarrhea to persist if used more often now. Will refill the Zofran she has used in the past and encouraged to hydrate well. - ondansetron (ZOFRAN) 4 MG tablet; Take 1 tablet (4 mg total) by mouth every 8 (eight) hours as needed for nausea or vomiting.  Dispense: 20 tablet; Refill: 0  3. Quadriplegia (HCC) Secondary to C5-C7 spinal cord injury 6 years ago. Status unchanged.   No follow-ups on file.    I discussed the assessment and treatment plan with the  patient. The patient was provided an opportunity to ask questions and all were answered. The patient agreed with the plan and demonstrated an understanding of the instructions.   The patient was advised to call back or seek an in-person evaluation if the symptoms worsen or if the condition fails to improve as anticipated.  I provided 15 minutes of non-face-to-face time during this encounter.   Dortha Kern, PA [facility] [phone] (phone) [phone] (fax)  [facility]

## 2019-07-09 DIAGNOSIS — M21372 Foot drop, left foot: Secondary | ICD-10-CM | POA: Diagnosis not present

## 2019-07-09 DIAGNOSIS — M21371 Foot drop, right foot: Secondary | ICD-10-CM | POA: Diagnosis not present

## 2019-07-09 DIAGNOSIS — R636 Underweight: Secondary | ICD-10-CM | POA: Diagnosis not present

## 2019-07-09 DIAGNOSIS — Z91040 Latex allergy status: Secondary | ICD-10-CM | POA: Insufficient documentation

## 2019-07-09 DIAGNOSIS — Z681 Body mass index (BMI) 19 or less, adult: Secondary | ICD-10-CM | POA: Diagnosis not present

## 2019-07-09 DIAGNOSIS — J398 Other specified diseases of upper respiratory tract: Secondary | ICD-10-CM | POA: Diagnosis not present

## 2019-07-09 DIAGNOSIS — N319 Neuromuscular dysfunction of bladder, unspecified: Secondary | ICD-10-CM | POA: Diagnosis not present

## 2019-07-09 DIAGNOSIS — S14105D Unspecified injury at C5 level of cervical spinal cord, subsequent encounter: Secondary | ICD-10-CM | POA: Diagnosis not present

## 2019-07-09 DIAGNOSIS — G8250 Quadriplegia, unspecified: Secondary | ICD-10-CM | POA: Diagnosis not present

## 2019-07-09 DIAGNOSIS — Z01818 Encounter for other preprocedural examination: Secondary | ICD-10-CM | POA: Diagnosis not present

## 2019-07-09 DIAGNOSIS — Z01812 Encounter for preprocedural laboratory examination: Secondary | ICD-10-CM | POA: Diagnosis not present

## 2019-07-09 DIAGNOSIS — Z20822 Contact with and (suspected) exposure to covid-19: Secondary | ICD-10-CM | POA: Diagnosis not present

## 2019-07-09 DIAGNOSIS — G904 Autonomic dysreflexia: Secondary | ICD-10-CM | POA: Diagnosis not present

## 2019-07-09 DIAGNOSIS — N390 Urinary tract infection, site not specified: Secondary | ICD-10-CM | POA: Diagnosis not present

## 2019-07-11 DIAGNOSIS — R221 Localized swelling, mass and lump, neck: Secondary | ICD-10-CM | POA: Diagnosis not present

## 2019-07-11 DIAGNOSIS — Z79899 Other long term (current) drug therapy: Secondary | ICD-10-CM | POA: Diagnosis not present

## 2019-07-11 DIAGNOSIS — G8250 Quadriplegia, unspecified: Secondary | ICD-10-CM | POA: Diagnosis not present

## 2019-07-11 DIAGNOSIS — L905 Scar conditions and fibrosis of skin: Secondary | ICD-10-CM | POA: Diagnosis not present

## 2019-07-11 DIAGNOSIS — J398 Other specified diseases of upper respiratory tract: Secondary | ICD-10-CM | POA: Diagnosis not present

## 2019-07-22 ENCOUNTER — Encounter: Payer: Medicare Other | Admitting: Family Medicine

## 2019-07-23 DIAGNOSIS — G894 Chronic pain syndrome: Secondary | ICD-10-CM | POA: Diagnosis not present

## 2019-07-23 DIAGNOSIS — S14105S Unspecified injury at C5 level of cervical spinal cord, sequela: Secondary | ICD-10-CM | POA: Diagnosis not present

## 2019-07-23 DIAGNOSIS — M62838 Other muscle spasm: Secondary | ICD-10-CM | POA: Diagnosis not present

## 2019-07-23 DIAGNOSIS — M7918 Myalgia, other site: Secondary | ICD-10-CM | POA: Diagnosis not present

## 2019-08-01 ENCOUNTER — Telehealth: Payer: Self-pay

## 2019-08-01 DIAGNOSIS — S14105S Unspecified injury at C5 level of cervical spinal cord, sequela: Secondary | ICD-10-CM

## 2019-08-01 DIAGNOSIS — G8250 Quadriplegia, unspecified: Secondary | ICD-10-CM

## 2019-08-01 DIAGNOSIS — S129XXS Fracture of neck, unspecified, sequela: Secondary | ICD-10-CM

## 2019-08-01 DIAGNOSIS — G904 Autonomic dysreflexia: Secondary | ICD-10-CM

## 2019-08-01 NOTE — Telephone Encounter (Signed)
Copied from CRM [id number]. Topic: General - Other >> Aug 01, 2019 10:09 AM Lyn Hollingshead D wrote: Reason for CRM: PT need a referral to  Physical Therapy - Medical [facility] [address], [place] [address]    Appointments  [phone]  [phone])

## 2019-08-02 DIAGNOSIS — R339 Retention of urine, unspecified: Secondary | ICD-10-CM | POA: Diagnosis not present

## 2019-08-05 NOTE — Telephone Encounter (Signed)
Patient is checking status on when referral will be made. Call back [phone]

## 2019-08-06 DIAGNOSIS — R339 Retention of urine, unspecified: Secondary | ICD-10-CM | POA: Diagnosis not present

## 2019-08-12 DIAGNOSIS — G8250 Quadriplegia, unspecified: Secondary | ICD-10-CM | POA: Diagnosis not present

## 2019-08-12 DIAGNOSIS — Z993 Dependence on wheelchair: Secondary | ICD-10-CM | POA: Diagnosis not present

## 2019-08-15 ENCOUNTER — Other Ambulatory Visit: Payer: Self-pay | Admitting: Family Medicine

## 2019-08-15 NOTE — Telephone Encounter (Signed)
Requested medication (s) are due for refill today: no  Requested medication (s) are on the active medication list: yes  Last refill:  01/14/2019 # 180 2 refills   Future visit scheduled: no  Notes to clinic:  not delegated per protocol      Requested Prescriptions  Pending Prescriptions Disp Refills   midodrine (PROAMATINE) 5 MG tablet [Pharmacy Med Name: MIDODRINE 5MG  TABLETS] 180 tablet 2    Sig: TAKE 2 TABLETS(10 MG) BY MOUTH THREE TIMES DAILY AS NEEDED      Not Delegated - Cardiovascular: Midodrine Failed - 08/15/2019  5:59 PM      Failed - This refill cannot be delegated      Passed - Last BP in normal range    BP Readings from Last 1 Encounters:  07/04/19 128/87          Passed - Valid encounter within last 12 months    Recent Outpatient Visits           1 month ago Diarrhea, unspecified type   07/06/19, [employer], [place]   1 month ago Encounter for other administrative examinations   [facility] [employer], MD   2 years ago Underweight   [facility], MD   2 years ago Conjunctivitis of left eye, unspecified conjunctivitis type   [facility], MD   2 years ago Annual physical exam   [facility], MD

## 2019-08-21 ENCOUNTER — Telehealth: Payer: Self-pay | Admitting: Family Medicine

## 2019-08-21 NOTE — Telephone Encounter (Signed)
Attempted to call patient to confirm the name of the medication needed. Unable to speak with patient due to connectivity issues. If patient is requesting refill of Midodrine 5 mg,refills were sent to the pharamacy on 08/18/19 #180 with 2 additional refills.

## 2019-08-21 NOTE — Telephone Encounter (Signed)
Patient called and confirmed that requested medication for blood pressure was Midodrine. Pt advised that prescription was available at the pharmacy and was sent on 08/27/19. Patient states her last contact with the pharmacy was on Friday, 08/15/19 and she was given an emergency refill. Patient informed that request was sent electronically from the pharmacy and refill is now available. Pt verbalized and understanding and will contact the pharmacy to see when refill would be available for pick up.

## 2019-08-21 NOTE — Telephone Encounter (Signed)
Medication Refill - Medication: Blood Pressure medicine  Has the patient contacted their pharmacy? Yes.   Patient has ran out of refills  (Agent: If no, request that the patient contact the pharmacy for the refill.) (Agent: If yes, when and what did the pharmacy advise?)  Preferred Pharmacy (with phone number or street name): Please contact patient once RX has been sent   Agent: Please be advised that RX refills may take up to 3 business days. We ask that you follow-up with your pharmacy.

## 2019-08-22 DIAGNOSIS — S14105A Unspecified injury at C5 level of cervical spinal cord, initial encounter: Secondary | ICD-10-CM | POA: Diagnosis not present

## 2019-08-25 DIAGNOSIS — M21371 Foot drop, right foot: Secondary | ICD-10-CM | POA: Diagnosis not present

## 2019-08-25 DIAGNOSIS — M21372 Foot drop, left foot: Secondary | ICD-10-CM | POA: Diagnosis not present

## 2019-08-29 DIAGNOSIS — S14105S Unspecified injury at C5 level of cervical spinal cord, sequela: Secondary | ICD-10-CM | POA: Diagnosis not present

## 2019-08-29 DIAGNOSIS — G8250 Quadriplegia, unspecified: Secondary | ICD-10-CM | POA: Diagnosis not present

## 2019-09-01 DIAGNOSIS — N319 Neuromuscular dysfunction of bladder, unspecified: Secondary | ICD-10-CM | POA: Diagnosis not present

## 2019-09-01 DIAGNOSIS — N390 Urinary tract infection, site not specified: Secondary | ICD-10-CM | POA: Diagnosis not present

## 2019-09-03 DIAGNOSIS — R339 Retention of urine, unspecified: Secondary | ICD-10-CM | POA: Diagnosis not present

## 2019-09-05 ENCOUNTER — Telehealth: Payer: Self-pay

## 2019-09-05 NOTE — Telephone Encounter (Signed)
Copied from CRM [id number]. Topic: General - Other >> Sep 05, 2019  2:26 PM Dalphine Handing A wrote: Medical transportation services paperwork will be faxed today for Dr. Sherrie Mustache to complete. Patient called to inform Dr. Sherrie Mustache . Patient would like a callback once paperwork has been completed and faxed back. Please advise

## 2019-09-09 DIAGNOSIS — Z7409 Other reduced mobility: Secondary | ICD-10-CM | POA: Diagnosis not present

## 2019-09-09 DIAGNOSIS — S14155S Other incomplete lesion at C5 level of cervical spinal cord, sequela: Secondary | ICD-10-CM | POA: Diagnosis not present

## 2019-09-09 DIAGNOSIS — R278 Other lack of coordination: Secondary | ICD-10-CM | POA: Diagnosis not present

## 2019-09-09 DIAGNOSIS — G8250 Quadriplegia, unspecified: Secondary | ICD-10-CM | POA: Diagnosis not present

## 2019-09-09 NOTE — Telephone Encounter (Signed)
Pt called with an update on Medical Transportation services paperwork that was faxed over on July 30th . Pt hasnt heard anything back and would like an update please advise

## 2019-09-10 DIAGNOSIS — G904 Autonomic dysreflexia: Secondary | ICD-10-CM | POA: Diagnosis not present

## 2019-09-10 DIAGNOSIS — S14155S Other incomplete lesion at C5 level of cervical spinal cord, sequela: Secondary | ICD-10-CM | POA: Diagnosis not present

## 2019-09-10 DIAGNOSIS — S129XXS Fracture of neck, unspecified, sequela: Secondary | ICD-10-CM | POA: Diagnosis not present

## 2019-09-10 DIAGNOSIS — G8250 Quadriplegia, unspecified: Secondary | ICD-10-CM | POA: Diagnosis not present

## 2019-09-10 NOTE — Telephone Encounter (Signed)
They've been received and filled out. Will send to medical records to be faxed as soon as possible

## 2019-09-10 NOTE — Telephone Encounter (Signed)
Attempted to contact patient, no answer or voicemail. Okay for PEC to advise patient.  

## 2019-09-11 NOTE — Telephone Encounter (Signed)
Phone call to pt.  Left detailed message on personal vm. That requested paperwork has been filled out and will be faxed by Medical Records.  Advised to call office if further questions.

## 2019-09-25 ENCOUNTER — Telehealth: Payer: Self-pay

## 2019-09-25 NOTE — Telephone Encounter (Signed)
Copied from CRM [id number]. Topic: General - Other >> Sep 25, 2019  3:29 PM Gwenlyn Fudge wrote: Reason for CRM: Morrie Sheldon, from [facility] from comp rehab, she states that she faxed over a medicare plan of care letter to office on 09/10/19 and 09/18/19. She is requesting an update. Please advise.  [phone]

## 2019-09-29 NOTE — Telephone Encounter (Signed)
Signed and sent to medical records to fax.  

## 2019-09-29 NOTE — Telephone Encounter (Signed)
Form placed on Dr. Sherrie Mustache desk for review.

## 2019-10-02 ENCOUNTER — Ambulatory Visit: Payer: Self-pay

## 2019-10-02 DIAGNOSIS — N390 Urinary tract infection, site not specified: Secondary | ICD-10-CM | POA: Diagnosis not present

## 2019-10-02 NOTE — Telephone Encounter (Signed)
Patient called stating that 3-4 days ago she was tested positive for COVID-19.  She is paraplegic and states she has no breathing issues. She has severe headache that she is treating with tylenol and ibuprofen. She states it help for a while but then returns as a 10. She has had low grade fever. She states her eyes hurt and are sensitive to light.  She has no Hx of migraines. Per protocol patient need virtual visit today. No available appointment at office.  She will go to [facility] for evaluation. Patient will call ahead to UC of choice to report her COVID-19 + status. Care advice read to patient.  She verbalized understanding.   Reason for Disposition . [1] SEVERE headache (e.g., excruciating) AND [2] not improved after 2 hours of pain medicine  Answer Assessment - Initial Assessment Questions 1. LOCATION: "Where does it hurt?"      eyes 2. ONSET: "When did the headache start?" (Minutes, hours or days)      3-4 days ago Dx with COVID 3. PATTERN: "Does the pain come and go, or has it been constant since it started?"     Constant sensitive to light 4. SEVERITY: "How bad is the pain?" and "What does it keep you from doing?"  (e.g., Scale 1-10; mild, moderate, or severe)   - MILD (1-3): doesn't interfere with normal activities    - MODERATE (4-7): interferes with normal activities or awakens from sleep    - SEVERE (8-10): excruciating pain, unable to do any normal activities       10 5. RECURRENT SYMPTOM: "Have you ever had headaches before?" If Yes, ask: "When was the last time?" and "What happened that time?"      No hx of Headaches but symptom of COVID-19 dx 6. CAUSE: "What do you think is causing the headache?"     Covid -19 7. MIGRAINE: "Have you been diagnosed with migraine headaches?" If Yes, ask: "Is this headache similar?"      No 8. HEAD INJURY: "Has there been any recent injury to the head?"      No 9. OTHER SYMPTOMS: "Do you have any other symptoms?" (fever, stiff neck, eye pain, sore  throat, cold symptoms)    Low grade fever, 10. PREGNANCY: "Is there any chance you are pregnant?" "When was your last menstrual period?"       No the 5 th of Aug  Protocols used: HEADACHE-A-AH

## 2019-10-04 DIAGNOSIS — R339 Retention of urine, unspecified: Secondary | ICD-10-CM | POA: Diagnosis not present

## 2019-10-08 DIAGNOSIS — G8250 Quadriplegia, unspecified: Secondary | ICD-10-CM | POA: Diagnosis not present

## 2019-10-15 DIAGNOSIS — S14105A Unspecified injury at C5 level of cervical spinal cord, initial encounter: Secondary | ICD-10-CM | POA: Diagnosis not present

## 2019-10-17 DIAGNOSIS — M7918 Myalgia, other site: Secondary | ICD-10-CM | POA: Diagnosis not present

## 2019-10-17 DIAGNOSIS — S14105S Unspecified injury at C5 level of cervical spinal cord, sequela: Secondary | ICD-10-CM | POA: Diagnosis not present

## 2019-10-17 DIAGNOSIS — R279 Unspecified lack of coordination: Secondary | ICD-10-CM | POA: Diagnosis not present

## 2019-10-17 DIAGNOSIS — Z0289 Encounter for other administrative examinations: Secondary | ICD-10-CM | POA: Insufficient documentation

## 2019-10-17 DIAGNOSIS — G894 Chronic pain syndrome: Secondary | ICD-10-CM | POA: Diagnosis not present

## 2019-10-17 DIAGNOSIS — M62838 Other muscle spasm: Secondary | ICD-10-CM | POA: Diagnosis not present

## 2019-10-17 DIAGNOSIS — Z789 Other specified health status: Secondary | ICD-10-CM | POA: Diagnosis not present

## 2019-10-17 DIAGNOSIS — G8250 Quadriplegia, unspecified: Secondary | ICD-10-CM | POA: Diagnosis not present

## 2019-10-24 DIAGNOSIS — S14105S Unspecified injury at C5 level of cervical spinal cord, sequela: Secondary | ICD-10-CM | POA: Diagnosis not present

## 2019-10-24 DIAGNOSIS — R279 Unspecified lack of coordination: Secondary | ICD-10-CM | POA: Diagnosis not present

## 2019-10-24 DIAGNOSIS — G8250 Quadriplegia, unspecified: Secondary | ICD-10-CM | POA: Diagnosis not present

## 2019-10-24 DIAGNOSIS — Z789 Other specified health status: Secondary | ICD-10-CM | POA: Diagnosis not present

## 2019-10-31 DIAGNOSIS — Z789 Other specified health status: Secondary | ICD-10-CM | POA: Diagnosis not present

## 2019-10-31 DIAGNOSIS — G8250 Quadriplegia, unspecified: Secondary | ICD-10-CM | POA: Diagnosis not present

## 2019-10-31 DIAGNOSIS — S14105S Unspecified injury at C5 level of cervical spinal cord, sequela: Secondary | ICD-10-CM | POA: Diagnosis not present

## 2019-10-31 DIAGNOSIS — R279 Unspecified lack of coordination: Secondary | ICD-10-CM | POA: Diagnosis not present

## 2019-11-06 DIAGNOSIS — N390 Urinary tract infection, site not specified: Secondary | ICD-10-CM | POA: Diagnosis not present

## 2019-11-07 DIAGNOSIS — R278 Other lack of coordination: Secondary | ICD-10-CM | POA: Diagnosis not present

## 2019-11-07 DIAGNOSIS — Z736 Limitation of activities due to disability: Secondary | ICD-10-CM | POA: Diagnosis not present

## 2019-11-07 DIAGNOSIS — G8250 Quadriplegia, unspecified: Secondary | ICD-10-CM | POA: Diagnosis not present

## 2019-11-07 DIAGNOSIS — S14107S Unspecified injury at C7 level of cervical spinal cord, sequela: Secondary | ICD-10-CM | POA: Diagnosis not present

## 2019-11-14 DIAGNOSIS — G894 Chronic pain syndrome: Secondary | ICD-10-CM | POA: Diagnosis not present

## 2019-11-14 DIAGNOSIS — R339 Retention of urine, unspecified: Secondary | ICD-10-CM | POA: Diagnosis not present

## 2019-11-14 DIAGNOSIS — Z681 Body mass index (BMI) 19 or less, adult: Secondary | ICD-10-CM | POA: Diagnosis not present

## 2019-11-14 DIAGNOSIS — S14105S Unspecified injury at C5 level of cervical spinal cord, sequela: Secondary | ICD-10-CM | POA: Diagnosis not present

## 2019-11-28 DIAGNOSIS — G8250 Quadriplegia, unspecified: Secondary | ICD-10-CM | POA: Diagnosis not present

## 2019-11-28 DIAGNOSIS — R278 Other lack of coordination: Secondary | ICD-10-CM | POA: Diagnosis not present

## 2019-11-28 DIAGNOSIS — Z736 Limitation of activities due to disability: Secondary | ICD-10-CM | POA: Diagnosis not present

## 2019-11-28 DIAGNOSIS — S14107S Unspecified injury at C7 level of cervical spinal cord, sequela: Secondary | ICD-10-CM | POA: Diagnosis not present

## 2019-12-15 DIAGNOSIS — R339 Retention of urine, unspecified: Secondary | ICD-10-CM | POA: Diagnosis not present

## 2019-12-17 ENCOUNTER — Telehealth: Payer: Self-pay

## 2019-12-17 ENCOUNTER — Ambulatory Visit (INDEPENDENT_AMBULATORY_CARE_PROVIDER_SITE_OTHER): Payer: Medicare Other | Admitting: Family Medicine

## 2019-12-17 ENCOUNTER — Other Ambulatory Visit: Payer: Self-pay

## 2019-12-17 ENCOUNTER — Encounter: Payer: Self-pay | Admitting: Family Medicine

## 2019-12-17 DIAGNOSIS — L989 Disorder of the skin and subcutaneous tissue, unspecified: Secondary | ICD-10-CM

## 2019-12-17 DIAGNOSIS — L299 Pruritus, unspecified: Secondary | ICD-10-CM

## 2019-12-17 MED ORDER — KETOCONAZOLE 2 % EX SHAM: 1.0000 "application " | MEDICATED_SHAMPOO | CUTANEOUS | 1 refills | Status: DC

## 2019-12-17 NOTE — Progress Notes (Signed)
MyChart Video Visit    Virtual Visit via Video Note   This visit type was conducted due to national recommendations for restrictions regarding the COVID-19 Pandemic (e.g. social distancing) in an effort to limit this patient's exposure and mitigate transmission in our community. This patient is at least at moderate risk for complications without adequate follow up. This format is felt to be most appropriate for this patient at this time. Physical exam was limited by quality of the video and audio technology used for the visit.   Patient location: home Provider location: [facility]  I discussed the limitations of evaluation and management by telemedicine and the availability of in person appointments. The patient expressed understanding and agreed to proceed.  Patient: Cassandra Lowe   DOB: 12/09/1990   29 y.o. Female  MRN: [id number] Visit Date: 12/17/2019  Today's healthcare provider: Mila Merry, MD   Chief Complaint  Patient presents with  . Hair/Scalp Problem   Subjective    HPI  Scalp problem: Patient complains of dry, itchy and flaky scalp. This has been a problem for several years. She believes these symptoms are related to side effects of taking Midodrine. Patient states the symptoms have worsened over the past few months. She says the flakes clump together and harden like a scab. She picks at the flakes and sometimes causes bleeding of the scalp. She states she usually takes 3 midodrine in the am, and sometimes 3 more in the afternoon. Otherwise she is so light headed she can't get out of bed.  She also has a bump on the front of her forehead for several months. It is flesh colored. Not painful. Tried several OTC acne medications without relief. Does not drain. Is very small, like a pimple. Is very prominent and she would like to have it removed.    Medications: Outpatient Medications Prior to Visit  Medication Sig  . Ascorbic Acid (VITAMIN C) 100 MG tablet Take 100 mg  by mouth daily. When remembers  . bisacodyl (MAGIC BULLETS) 10 MG suppository Place 1 suppository (10 mg total) rectally daily as needed for mild constipation or moderate constipation.  . cyclobenzaprine (FLEXERIL) 10 MG tablet Take 10 mg by mouth 3 (three) times daily as needed.   . Ginger, Zingiber officinalis, (GINGER PO) Take by mouth as needed.  . midodrine (PROAMATINE) 5 MG tablet TAKE 2 TABLETS(10 MG) BY MOUTH THREE TIMES DAILY AS NEEDED  . ondansetron (ZOFRAN) 4 MG tablet Take 1 tablet (4 mg total) by mouth every 8 (eight) hours as needed for nausea or vomiting.  Marland Kitchen albuterol (PROVENTIL HFA;VENTOLIN HFA) 108 (90 Base) MCG/ACT inhaler Inhale 2 puffs into the lungs every 6 (six) hours as needed for wheezing or shortness of breath. (Patient not taking: Reported on 12/17/2019)  . oxybutynin (DITROPAN XL) 15 MG 24 hr tablet Take 15 mg by mouth 2 (two) times daily. (Patient not taking: Reported on 12/17/2019)   No facility-administered medications prior to visit.    Review of Systems  Constitutional: Negative for appetite change, chills, fatigue and fever.  Respiratory: Negative for chest tightness and shortness of breath.   Cardiovascular: Negative for chest pain and palpitations.  Gastrointestinal: Negative for abdominal pain, nausea and vomiting.  Skin:       Dry flaky scalp  Neurological: Negative for dizziness and weakness.     Objective    There were no vitals taken for this visit.  Physical Exam   Awake, alert, oriented x 3. In no apparent  distress   Assessment & Plan     1. Pruritus of scalp She attributes this to midodrine, but reluctant to change since it is so effective and potential adverse effects of other medications for orthostatic hypotension. She may have scalp tinea. Will try ketoconazole shampoo. She is also being referred to dermatology who can take a look at scalp if not improving with ketoconazole.   2. Benign skin lesion of forehead Has been present for  several months and very prominently displayed. She would like evaluation for possible excision. She is going to call back with name of dermatologist she would like to see.         I discussed the assessment and treatment plan with the patient. The patient was provided an opportunity to ask questions and all were answered. The patient agreed with the plan and demonstrated an understanding of the instructions.   The patient was advised to call back or seek an in-person evaluation if the symptoms worsen or if the condition fails to improve as anticipated.  I provided 12 minutes of non-face-to-face time during this encounter.  The entirety of the information documented in the History of Present Illness, Review of Systems and Physical Exam were personally obtained by me. Portions of this information were initially documented by the CMA and reviewed by me for thoroughness and accuracy.     Mila Merry, MD [facility] [phone] (phone) [phone] (fax)  [facility]

## 2019-12-17 NOTE — Telephone Encounter (Signed)
Copied from CRM [id number]. Topic: Referral - Request for Referral >> Dec 17, 2019 10:33 AM Dalphine Handing A wrote: Patient would like a dermatology referral placed to [facility], Dr .Melida Quitter phone [phone] fax [phone]. Please advise

## 2019-12-17 NOTE — Telephone Encounter (Signed)
Please advise if ok to order referral. Referral order placed and pended in this encounter.

## 2019-12-29 DIAGNOSIS — B081 Molluscum contagiosum: Secondary | ICD-10-CM | POA: Diagnosis not present

## 2019-12-29 DIAGNOSIS — L218 Other seborrheic dermatitis: Secondary | ICD-10-CM | POA: Diagnosis not present

## 2019-12-30 ENCOUNTER — Other Ambulatory Visit: Payer: Self-pay | Admitting: Family Medicine

## 2019-12-30 NOTE — Telephone Encounter (Signed)
Please review. Thanks!  

## 2019-12-30 NOTE — Telephone Encounter (Signed)
Requested medication (s) are due for refill today:yes  Requested medication (s) are on the active medication list:yes   Last refill: 08/18/19  #180  2 refills  Future visit scheduled no   Notes to clinic:  not delegated  Requested Prescriptions  Pending Prescriptions Disp Refills   midodrine (PROAMATINE) 5 MG tablet [Pharmacy Med Name: MIDODRINE 5MG  TABLETS] 180 tablet 2    Sig: TAKE 2 TABLETS(10 MG) BY MOUTH THREE TIMES DAILY AS NEEDED      Not Delegated - Cardiovascular: Midodrine Failed - 12/30/2019  3:47 AM      Failed - This refill cannot be delegated      Passed - Last BP in normal range    BP Readings from Last 1 Encounters:  07/04/19 128/87          Passed - Valid encounter within last 12 months    Recent Outpatient Visits           1 week ago Pruritus of scalp   [facility], MD   5 months ago Diarrhea, unspecified type   [facility], [facility], [facility]   5 months ago Encounter for other administrative examinations   [facility], [facility], MD   2 years ago Underweight   [facility], MD   2 years ago Conjunctivitis of left eye, unspecified conjunctivitis type   [facility], MD

## 2020-01-08 DIAGNOSIS — S14105S Unspecified injury at C5 level of cervical spinal cord, sequela: Secondary | ICD-10-CM | POA: Diagnosis not present

## 2020-01-08 DIAGNOSIS — G894 Chronic pain syndrome: Secondary | ICD-10-CM | POA: Diagnosis not present

## 2020-01-14 DIAGNOSIS — R339 Retention of urine, unspecified: Secondary | ICD-10-CM | POA: Diagnosis not present

## 2020-02-04 DIAGNOSIS — N390 Urinary tract infection, site not specified: Secondary | ICD-10-CM | POA: Diagnosis not present

## 2020-02-11 ENCOUNTER — Ambulatory Visit: Payer: Medicare Other | Admitting: Family Medicine

## 2020-02-18 ENCOUNTER — Other Ambulatory Visit: Payer: Self-pay

## 2020-02-18 ENCOUNTER — Telehealth (INDEPENDENT_AMBULATORY_CARE_PROVIDER_SITE_OTHER): Payer: Medicare Other | Admitting: Family Medicine

## 2020-02-18 DIAGNOSIS — B081 Molluscum contagiosum: Secondary | ICD-10-CM | POA: Diagnosis not present

## 2020-02-18 DIAGNOSIS — S14105S Unspecified injury at C5 level of cervical spinal cord, sequela: Secondary | ICD-10-CM

## 2020-02-18 DIAGNOSIS — G8250 Quadriplegia, unspecified: Secondary | ICD-10-CM | POA: Diagnosis not present

## 2020-02-18 DIAGNOSIS — N319 Neuromuscular dysfunction of bladder, unspecified: Secondary | ICD-10-CM | POA: Diagnosis not present

## 2020-02-18 DIAGNOSIS — G904 Autonomic dysreflexia: Secondary | ICD-10-CM | POA: Diagnosis not present

## 2020-02-18 NOTE — Progress Notes (Signed)
MyChart Video Visit    Virtual Visit via Video Note   This visit type was conducted due to national recommendations for restrictions regarding the COVID-19 Pandemic (e.g. social distancing) in an effort to limit this patient's exposure and mitigate transmission in our community. This patient is at least at moderate risk for complications without adequate follow up. This format is felt to be most appropriate for this patient at this time. Physical exam was limited by quality of the video and audio technology used for the visit.   Patient location: home Provider location: [facility]  I discussed the limitations of evaluation and management by telemedicine and the availability of in person appointments. The patient expressed understanding and agreed to proceed.  Patient: Cassandra Lowe   DOB: [birth year]   30 y.o. Female  MRN: [id number] Visit Date: 02/18/2020  Today's healthcare provider: Mila Merry, MD   No chief complaint on file.  Subjective    HPI   Virtual visit to discuss an order for a power wheelchair. Patient is quadriplegic secondary to remote history of C5-C7 burst fracture injury in 2016. She has had her current manual wheelchair for several year which has been modified with a power add-on, but is now aging and become unreliable. She also has neurogenic bladder requiring and requires the ability to recline to safely and comfortable perform manual catheterizations. She also had difficulties transferring into and out of wheelchair and requires standing frame and weight shifting add-ons.   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 She was advised by the dermatologist to return to have lesions frozen or leave them alone. Patient is wondering if there is a topical medication she can use.   Patient Active Problem List   Diagnosis Date Noted  . Oropharyngeal dysphagia 03/24/2017  . Nausea 03/24/2017  . Neurogenic bladder 09/08/2016  . Paronychia  of great toe, right 08/02/2016  . Bipolar affective disorder, current episode manic without psychotic symptoms (HCC) 07/27/2015  . Fracture cervical vertebra-closed (HCC) 05/20/2015  . Lack of concentration 05/20/2015  . Quadriplegia (HCC) 05/20/2015  . 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 Chronic respiratory insufficiency 03/10/2014  . C5-C7 level spinal cord injury (HCC) 02/15/2014  . Burst fracture of cervical vertebra (HCC) 02/11/2014   Past Medical History:  Diagnosis Date  . Burst fracture of cervical vertebra (HCC)   . History of chicken pox   . Quadriplegia [facility])    Social History   Tobacco Use  . Smoking status: Never Smoker  . Smokeless tobacco: Never Used  Vaping Use  . Vaping Use: Never used  Substance Use Topics  . Alcohol use: No    Alcohol/week: 0.0 standard drinks  . Drug use: No   Allergies  Allergen Reactions  . Baclofen     Memory loss  . Morphine And Related Nausea Only  . Oxycodone Itching  . Tape     Other reaction(s): Other (See Comments) "Skin burns" Skin breakdown  . Latex     Other reaction(s): Other (See Comments) "Skin burns" Skin breakdown Other reaction(s): Other (See Comments) blisters  . Tramadol Itching    Patient can take if she takes a benadryl with it. Patient can take if she takes a benadryl with it.     Medications: Outpatient Medications Prior to Visit  Medication Sig  . Ascorbic Acid (VITAMIN C) 100 MG tablet Take 100 mg by mouth daily. When remembers  . bisacodyl (MAGIC BULLETS) 10 MG suppository Place 1 suppository (  10 mg total) rectally daily as needed for mild constipation or moderate constipation.  . cyclobenzaprine (FLEXERIL) 10 MG tablet Take 10 mg by mouth 3 (three) times daily as needed.   . Ginger, Zingiber officinalis, (GINGER PO) Take by mouth as needed.  Marland Kitchen ketoconazole (NIZORAL) 2 % shampoo Apply 1 application topically 2 (two) times a week.  . midodrine (PROAMATINE) 5 MG tablet TAKE 2 TABLETS(10 MG) BY MOUTH  THREE TIMES DAILY AS NEEDED  . ondansetron (ZOFRAN) 4 MG tablet Take 1 tablet (4 mg total) by mouth every 8 (eight) hours as needed for nausea or vomiting.  Marland Kitchen albuterol (PROVENTIL HFA;VENTOLIN HFA) 108 (90 Base) MCG/ACT inhaler Inhale 2 puffs into the lungs every 6 (six) hours as needed for wheezing or shortness of breath. (Patient not taking: No sig reported)   No facility-administered medications prior to visit.     Objective      Physical Exam   Awake, alert, oriented x 3. In no apparent distress  She is wheelchair confined. Has no movement of lower extremities. Hypertonic upper extremities. Slight movements of fingers, hands, forearms and wrists sufficient to operated hand controls. Unable to flex or extend shoulders.   Several small fleshy warty lesion on forehead c/w molluscum contagiosum.   Assessment & Plan     1. C5-C7 level spinal cord injury, sequela (HCC)   2. Autonomic dysreflexia   3. Neurogenic bladder   4. Quadriplegia (HCC) As documented in HPI she is in medical need for new motorized wheel chair with standing frame, weight shifting features, and reclining frame.   5. Molluscum contagiosum  - podofilox (CONDYLOX) 0.5 % gel; Apply to affected area for 3 days, then stop for 4 days. Repeat for 4 weeks.  Dispense: 3.5 g; Refill: 1        I discussed the assessment and treatment plan with the patient. The patient was provided an opportunity to ask questions and all were answered. The patient agreed with the plan and demonstrated an understanding of the instructions.   The patient was advised to call back or seek an in-person evaluation if the symptoms worsen or if the condition fails to improve as anticipated.  I provided 15 minutes of non-face-to-face time during this encounter.  The entirety of the information documented in the History of Present Illness, Review of Systems and Physical Exam were personally obtained by me. Portions of this information were  initially documented by the CMA and reviewed by me for thoroughness and accuracy.     Mila Merry, MD [facility] [phone] (phone) [phone] (fax)  [facility]

## 2020-02-20 MED ORDER — PODOFILOX 0.5 % EX GEL: CUTANEOUS | 1 refills | Status: DC

## 2020-03-03 DIAGNOSIS — B081 Molluscum contagiosum: Secondary | ICD-10-CM | POA: Diagnosis not present

## 2020-03-03 DIAGNOSIS — L700 Acne vulgaris: Secondary | ICD-10-CM | POA: Diagnosis not present

## 2020-03-04 DIAGNOSIS — Z20822 Contact with and (suspected) exposure to covid-19: Secondary | ICD-10-CM | POA: Diagnosis not present

## 2020-03-05 DIAGNOSIS — M7918 Myalgia, other site: Secondary | ICD-10-CM | POA: Diagnosis not present

## 2020-03-05 DIAGNOSIS — G894 Chronic pain syndrome: Secondary | ICD-10-CM | POA: Diagnosis not present

## 2020-03-05 DIAGNOSIS — S14105S Unspecified injury at C5 level of cervical spinal cord, sequela: Secondary | ICD-10-CM | POA: Diagnosis not present

## 2020-03-11 ENCOUNTER — Telehealth (INDEPENDENT_AMBULATORY_CARE_PROVIDER_SITE_OTHER): Payer: Medicare Other

## 2020-03-11 DIAGNOSIS — R399 Unspecified symptoms and signs involving the genitourinary system: Secondary | ICD-10-CM | POA: Diagnosis not present

## 2020-03-11 NOTE — Telephone Encounter (Signed)
Copied from CRM [id number]. Topic: Quick Communication - Rx Refill/Question >> Mar 11, 2020 12:15 PM Lyn Hollingshead D wrote: podofilox (CONDYLOX) 0.5 % gel [[id number]]  insurance will not cover this medication Pt requesting a generic or similar / also request ordes to drop off urine sample / possible UTI // please advise

## 2020-03-17 NOTE — Telephone Encounter (Signed)
OK to drop off u/a if having UTI sx. I don't know of any other topical treatments for skin lesions. These are usually frozen if the podophyllin is not available.

## 2020-03-18 NOTE — Addendum Note (Signed)
Addended by: Janey Greaser D on: 03/18/2020 10:03 AM   Modules accepted: Orders

## 2020-03-22 DIAGNOSIS — R339 Retention of urine, unspecified: Secondary | ICD-10-CM | POA: Diagnosis not present

## 2020-03-24 ENCOUNTER — Telehealth: Payer: Self-pay | Admitting: Family Medicine

## 2020-03-24 NOTE — Telephone Encounter (Signed)
Patient has a question about her BP medication.  Patient stated that the pharmacy would not fill the script until the first of the month.  CB# [phone]

## 2020-03-25 ENCOUNTER — Other Ambulatory Visit: Payer: Self-pay | Admitting: Family Medicine

## 2020-03-25 LAB — POCT URINALYSIS DIPSTICK
Bilirubin, UA: NEGATIVE
Glucose, UA: NEGATIVE
Ketones, UA: NEGATIVE
Nitrite, UA: POSITIVE
Protein, UA: NEGATIVE
Spec Grav, UA: 1.015 (ref 1.010–1.025)
Urobilinogen, UA: 0.2 E.U./dL
pH, UA: 7.5 (ref 5.0–8.0)

## 2020-03-25 MED ORDER — MIDODRINE HCL 5 MG PO TABS: 5.0000 mg | ORAL_TABLET | Freq: Three times a day (TID) | ORAL | 1 refills | Status: DC

## 2020-03-25 NOTE — Telephone Encounter (Signed)
Requested medication (s) are due for refill today: 03/25/20  Requested medication (s) are on the active medication list: yes  Last refill:  03/25/20 #180 1 refill  Future visit scheduled: no  Notes to clinic:  not delegated per protocol     Requested Prescriptions  Pending Prescriptions Disp Refills   midodrine (PROAMATINE) 5 MG tablet [Pharmacy Med Name: MIDODRINE 5MG  TABLETS] 540 tablet     Sig: TAKE 1 TO 2 TABLETS(5 TO 10 MG) BY MOUTH THREE TIMES DAILY      Not Delegated - Cardiovascular: Midodrine Failed - 03/25/2020  2:24 PM      Failed - This refill cannot be delegated      Passed - Last BP in normal range    BP Readings from Last 1 Encounters:  07/04/19 128/87          Passed - Valid encounter within last 12 months    Recent Outpatient Visits           1 month ago Quadriplegia [facility])   [facility], MD   3 months ago Pruritus of scalp   [facility], MD   8 months ago Diarrhea, unspecified type   [facility], [facility], PA-C   8 months ago Encounter for other administrative examinations   [facility] [place], [facility], MD   2 years ago Underweight   [facility], Sherrie Mustache, MD

## 2020-03-25 NOTE — Addendum Note (Signed)
Addended by: Benjiman Core on: 03/25/2020 12:20 PM   Modules accepted: Orders

## 2020-03-25 NOTE — Telephone Encounter (Signed)
The maximum approved dose is 10mg  three times a day. I sent prescription to [facility] stating it could be refilled now since she is out, but she is not to take more than the maximum approved dose. Insurance may not cover it in which case she will have to pay out pocket.

## 2020-03-25 NOTE — Telephone Encounter (Signed)
Patient's husband Minerva Areola came by the office stating that patient has a history of chronic low blood pressure. She takes Midodrine to bring her blood pressure up. 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 Patient has been taking 3 tablets in the morning and 2 tablets two more times later in the day. Patient has run out of medication quicker since she started taking more medication. Her insurance and pharmacy will not allow her to get a refill until March. Patient wants to know if the dosage or directions can be adjusted so that she can get the prescription filled sooner? Please advise.

## 2020-03-25 NOTE — Telephone Encounter (Addendum)
Patient's husband Minerva Areola came by the office and dropped off a urine sample. Patient has had symptoms of nausea, cloudy urine with odor. Patient also uses a urinary catheter. Please review results of POCT UA. Urine culture and urine micro sent off to [facility].

## 2020-03-26 LAB — URINALYSIS, MICROSCOPIC ONLY
Casts: NONE SEEN /lpf
RBC, Urine: NONE SEEN /hpf (ref 0–2)

## 2020-03-26 NOTE — Telephone Encounter (Signed)
Patient advised and verbalized understanding 

## 2020-03-30 LAB — URINE CULTURE

## 2020-03-31 ENCOUNTER — Telehealth: Payer: Self-pay

## 2020-03-31 DIAGNOSIS — R110 Nausea: Secondary | ICD-10-CM

## 2020-03-31 DIAGNOSIS — Z20822 Contact with and (suspected) exposure to covid-19: Secondary | ICD-10-CM | POA: Diagnosis not present

## 2020-03-31 MED ORDER — CEPHALEXIN 500 MG PO CAPS: 500.0000 mg | ORAL_CAPSULE | Freq: Three times a day (TID) | ORAL | 0 refills | Status: AC

## 2020-03-31 MED ORDER — ONDANSETRON HCL 4 MG PO TABS: 4.0000 mg | ORAL_TABLET | Freq: Three times a day (TID) | ORAL | 1 refills | Status: DC | PRN

## 2020-03-31 NOTE — Telephone Encounter (Signed)
Patient advised as below. Patient reports she had a bad reaction to nitrofurantoin, reports a lot of diarrhea. Patient is requesting a different antibiotic and nausea medication. Please advise.

## 2020-03-31 NOTE — Telephone Encounter (Signed)
Ok, then prescription cephalexin 500mg  one every eight hours for 7 days.

## 2020-03-31 NOTE — Telephone Encounter (Signed)
-----   Message from Malva Limes, MD sent at 03/30/2020  1:44 PM EST ----- UTI on culture. Needs to start nitrofurantoin 100mg  twice a day for 7 days.

## 2020-03-31 NOTE — Telephone Encounter (Signed)
Ok to send refill in for zofran 4mg  #20, rf x 1

## 2020-03-31 NOTE — Telephone Encounter (Signed)
Prescriptions sent into pharmacy. Left detailed message on patients husband Eric's voice message system.

## 2020-04-07 DIAGNOSIS — B081 Molluscum contagiosum: Secondary | ICD-10-CM | POA: Diagnosis not present

## 2020-04-07 DIAGNOSIS — R238 Other skin changes: Secondary | ICD-10-CM | POA: Diagnosis not present

## 2020-04-08 DIAGNOSIS — Z7982 Long term (current) use of aspirin: Secondary | ICD-10-CM | POA: Diagnosis not present

## 2020-04-08 DIAGNOSIS — Z885 Allergy status to narcotic agent status: Secondary | ICD-10-CM | POA: Diagnosis not present

## 2020-04-08 DIAGNOSIS — Z79899 Other long term (current) drug therapy: Secondary | ICD-10-CM | POA: Diagnosis not present

## 2020-04-08 DIAGNOSIS — R252 Cramp and spasm: Secondary | ICD-10-CM | POA: Diagnosis not present

## 2020-04-08 DIAGNOSIS — Z993 Dependence on wheelchair: Secondary | ICD-10-CM | POA: Diagnosis not present

## 2020-04-08 DIAGNOSIS — N390 Urinary tract infection, site not specified: Secondary | ICD-10-CM | POA: Diagnosis not present

## 2020-04-08 DIAGNOSIS — S14105S Unspecified injury at C5 level of cervical spinal cord, sequela: Secondary | ICD-10-CM | POA: Diagnosis not present

## 2020-04-08 DIAGNOSIS — N319 Neuromuscular dysfunction of bladder, unspecified: Secondary | ICD-10-CM | POA: Diagnosis not present

## 2020-04-08 DIAGNOSIS — K5900 Constipation, unspecified: Secondary | ICD-10-CM | POA: Diagnosis not present

## 2020-04-21 DIAGNOSIS — R339 Retention of urine, unspecified: Secondary | ICD-10-CM | POA: Diagnosis not present

## 2020-04-26 DIAGNOSIS — R339 Retention of urine, unspecified: Secondary | ICD-10-CM | POA: Diagnosis not present

## 2020-05-22 DIAGNOSIS — R339 Retention of urine, unspecified: Secondary | ICD-10-CM | POA: Diagnosis not present

## 2020-06-17 ENCOUNTER — Other Ambulatory Visit: Payer: Self-pay | Admitting: Family Medicine

## 2020-06-17 NOTE — Telephone Encounter (Signed)
Requested medications are due for refill today.  yes  Requested medications are on the active medications list.  yes  Last refill. 03/25/2020  Future visit scheduled.   no  Notes to clinic.  Medication not delegated. 

## 2020-06-22 DIAGNOSIS — R339 Retention of urine, unspecified: Secondary | ICD-10-CM | POA: Diagnosis not present

## 2020-07-07 DIAGNOSIS — N319 Neuromuscular dysfunction of bladder, unspecified: Secondary | ICD-10-CM | POA: Diagnosis not present

## 2020-07-07 DIAGNOSIS — G8220 Paraplegia, unspecified: Secondary | ICD-10-CM | POA: Diagnosis not present

## 2020-07-07 DIAGNOSIS — Z91040 Latex allergy status: Secondary | ICD-10-CM | POA: Diagnosis not present

## 2020-07-07 DIAGNOSIS — Z881 Allergy status to other antibiotic agents status: Secondary | ICD-10-CM | POA: Diagnosis not present

## 2020-07-07 DIAGNOSIS — Z885 Allergy status to narcotic agent status: Secondary | ICD-10-CM | POA: Diagnosis not present

## 2020-07-17 DIAGNOSIS — N3001 Acute cystitis with hematuria: Secondary | ICD-10-CM | POA: Diagnosis not present

## 2020-07-17 DIAGNOSIS — R110 Nausea: Secondary | ICD-10-CM | POA: Diagnosis not present

## 2020-07-19 DIAGNOSIS — N319 Neuromuscular dysfunction of bladder, unspecified: Secondary | ICD-10-CM | POA: Diagnosis not present

## 2020-07-19 DIAGNOSIS — N390 Urinary tract infection, site not specified: Secondary | ICD-10-CM | POA: Diagnosis not present

## 2020-07-20 ENCOUNTER — Telehealth: Payer: Self-pay

## 2020-07-20 DIAGNOSIS — S129XXS Fracture of neck, unspecified, sequela: Secondary | ICD-10-CM

## 2020-07-20 DIAGNOSIS — G8250 Quadriplegia, unspecified: Secondary | ICD-10-CM

## 2020-07-20 MED ORDER — EPINEPHRINE 0.3 MG/0.3ML IJ SOAJ: 0.3000 mg | INTRAMUSCULAR | 3 refills | Status: DC | PRN

## 2020-07-20 NOTE — Telephone Encounter (Signed)
Reviewed med list and did not see epi pen as active or past drug. I contacted patient who states that she needs epi-pen because she does not have a normal respiratory response. Patient states that she saw allergist and states that she has "summer allergies" and high latex allergy. Patient states that specialist had advised her that since she has spinal cord injury and has high allergies she should have a epi-pen on hand in case. Patient states that she is currently in PT and since she has a standing frame it was suggested to her to have BMD. Please review chart and advise. KW

## 2020-07-20 NOTE — Telephone Encounter (Signed)
Copied from CRM [id number]. Topic: General - Other >> Jul 19, 2020  4:56 PM Pawlus, Maxine Glenn A wrote: Reason for CRM: Pt was requesting an Epi-pen, please advise if this is possible. Pt was also asking about getting a bone density test done.

## 2020-07-20 NOTE — Addendum Note (Signed)
Addended by: Malva Limes on: 07/20/2020 02:51 PM   Modules accepted: Orders

## 2020-07-30 DIAGNOSIS — N390 Urinary tract infection, site not specified: Secondary | ICD-10-CM | POA: Diagnosis not present

## 2020-08-04 DIAGNOSIS — Z885 Allergy status to narcotic agent status: Secondary | ICD-10-CM | POA: Diagnosis not present

## 2020-08-04 DIAGNOSIS — Z993 Dependence on wheelchair: Secondary | ICD-10-CM | POA: Diagnosis not present

## 2020-08-04 DIAGNOSIS — Z7982 Long term (current) use of aspirin: Secondary | ICD-10-CM | POA: Diagnosis not present

## 2020-08-04 DIAGNOSIS — Z436 Encounter for attention to other artificial openings of urinary tract: Secondary | ICD-10-CM | POA: Diagnosis not present

## 2020-08-04 DIAGNOSIS — N23 Unspecified renal colic: Secondary | ICD-10-CM | POA: Diagnosis not present

## 2020-08-04 DIAGNOSIS — N319 Neuromuscular dysfunction of bladder, unspecified: Secondary | ICD-10-CM | POA: Diagnosis not present

## 2020-08-05 ENCOUNTER — Ambulatory Visit: Payer: Self-pay | Admitting: *Deleted

## 2020-08-05 ENCOUNTER — Other Ambulatory Visit: Payer: Self-pay | Admitting: Family Medicine

## 2020-08-05 NOTE — Telephone Encounter (Signed)
Patient calling with unusual throat sensations. Hx quadriplegic, trach in the past-right-sided neck abnormality with air sounds, pills feeling stuck in the throat. No difficulty swallowing or breathing at this time- but these are new sensations for her. Increased acid reflux today also.  No availability with pcp-patient will seek evaluation at [facility] today/tomorrow.   Requesting appointment with Dr. Sherrie Mustache for other needs including recent surgery information and other updated information and a TB test.  No availability 2 weeks out. Routing to office for any availability within 2 weeks. Reason for Disposition . Nursing judgment or information in reference  Answer Assessment - Initial Assessment Questions 1. REASON FOR CALL: "What is your main concern right now?"     New throat sensations with a history of trach 2. ONSET: "When did the popping/air sounds and pills feeling stuck in the throat start?"     Today 3. SEVERITY: "How bad is the ?"     Just concerning due to her history 4. FEVER: "Do you have a fever?"     no 5. OTHER SYMPTOMS: "Do you have any other new symptoms?"     Increased acid reflux 6. TREATMENTS AND RESPONSE: "What have you done so far to try to make this better? What medicines have you used?"     nothing 7. PREGNANCY: "Is there any chance you are pregnant?" "When was your last menstrual period?"     no  Protocols used: No Guideline Available-A-AH

## 2020-08-06 DIAGNOSIS — Z5329 Procedure and treatment not carried out because of patient's decision for other reasons: Secondary | ICD-10-CM | POA: Diagnosis not present

## 2020-08-06 DIAGNOSIS — R1310 Dysphagia, unspecified: Secondary | ICD-10-CM | POA: Diagnosis not present

## 2020-08-06 DIAGNOSIS — R29898 Other symptoms and signs involving the musculoskeletal system: Secondary | ICD-10-CM | POA: Diagnosis not present

## 2020-08-12 ENCOUNTER — Other Ambulatory Visit: Payer: Medicare Other

## 2020-08-16 ENCOUNTER — Other Ambulatory Visit: Payer: Medicare Other

## 2020-08-20 ENCOUNTER — Telehealth: Payer: Self-pay

## 2020-08-20 NOTE — Telephone Encounter (Signed)
Copied from CRM [id number]. 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   Best contact: [phone]

## 2020-08-20 NOTE — Telephone Encounter (Signed)
I called and spoke with patient. She says she wants blood work done to check "everything". She says she hasn't had a full panel of blood work done in a while. I advised patient that  she would need an office visit. Patient agreed to schedule appointment. Appointment has been made for Tuesday, 08/24/2020 at 9am for a CPE. Patient does not want a pap or breast exam. She says she just wants to talk to Dr. Sherrie Mustache to let him know what has been going on with her health these past few months, and to have labs done.

## 2020-08-20 NOTE — Telephone Encounter (Signed)
Why does she want gallbladder checked.... has she been seen for that?

## 2020-08-24 ENCOUNTER — Telehealth: Payer: Self-pay

## 2020-08-24 ENCOUNTER — Encounter: Payer: Self-pay | Admitting: Family Medicine

## 2020-08-24 NOTE — Telephone Encounter (Signed)
Copied from CRM [id number]. Topic: Appointment Scheduling - Scheduling Inquiry for Clinic >> Aug 24, 2020  9:21 AM Wyonia Hough E wrote: Reason for CRM: Pt wasn't able to come to appt today / she stated she wasn't able to come in due to care giver issues and wanted to know is she can still come in and get blood work and TB test / pt mentioned she may need a virtual due to not having access to her wheelchair / again mentioned issue with care giver/ please advise

## 2020-08-24 NOTE — Progress Notes (Deleted)
Complete physical exam   Patient: Cassandra Lowe   DOB: 06-15-1990   30 y.o. Female  MRN: [id number] Visit Date: 08/24/2020  Today's healthcare provider: Mila Merry, MD   No chief complaint on file.  Subjective    Cassandra Lowe is a 30 y.o. female who presents today for a complete physical exam.  She reports consuming a {diet types:17450} diet. {Exercise:19826} She generally feels {well/fairly well/poorly:18703}. She reports sleeping {well/fairly well/poorly:18703}. She {does/does not:200015} have additional problems to discuss today.  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 She is also requesting lab work. She reports that there have been some changes in her medical health within the past year.  Past Medical History:  Diagnosis Date   Burst fracture of cervical vertebra (HCC)    History of chicken pox    Quadriplegia (HCC)    Past Surgical History:  Procedure Laterality Date   Bladder botox     C4-C6 cervical fusion with Decompression or C5 burst fracture  02/12/2014   MOUTH SURGERY     one tooth removed   Reversal of Tracheostomy  03/22/2014   TRACHEOSTOMY  02/26/2014   pallative tracheostomy for respiratory failure secondary to C-spine fracture and paraplegia   Social History   Socioeconomic History   Marital status: Single    Spouse name: Not on file   Number of children: 1   Years of education: Not on file   Highest education level: Bachelor's degree (e.g., BA, AB, BS)  Occupational History   Occupation: disabled  Tobacco Use   Smoking status: Never   Smokeless tobacco: Never  Vaping Use   Vaping Use: Never used  Substance and Sexual Activity   Alcohol use: No    Alcohol/week: 0.0 standard drinks   Drug use: No   Sexual activity: Not on file    Comment: sexually active at 30 years of age; has had one sexual partner. Used condoms  Other Topics Concern   Not on file  Social History Narrative   Not on  file   Social Determinants of Health   Financial Resource Strain: Not on file  Food Insecurity: Not on file  Transportation Needs: Not on file  Physical Activity: Not on file  Stress: Not on file  Social Connections: Not on file  Intimate Partner Violence: Not on file   Family Status  Relation Name Status   Mother  Alive   Father  Alive   Sister  Alive   Brother  Alive   Other Grandfather Alive   Other Grandmother Alive   Family History  Problem Relation Age of Onset   Ovarian cysts Mother    Dysmenorrhea Mother    Diabetes Mother        type 2; having complications   Hypertension Mother    Bipolar disorder Brother    Schizophrenia Brother    Hypertension Other    Diabetes Other    Allergies  Allergen Reactions   Nitrofurantoin Diarrhea   Baclofen     Memory loss   Morphine And Related Nausea Only   Oxycodone Itching   Tape     Other reaction(s): Other (See Comments) "Skin burns" Skin breakdown   Latex     Other reaction(s): Other (See Comments) "Skin burns" Skin breakdown Other reaction(s): Other (See Comments) blisters   Tramadol Itching    Patient can take if she takes a benadryl with it.  Patient Care Team: Malva Limes, MD as PCP - General (Family Medicine) Raoul Pitch Soyla Dryer, MD as Referring Physician (Urology) Caro Hight, MD as Referring Physician (Physical Medicine and Rehabilitation)   Medications: Outpatient Medications Prior to Visit  Medication Sig   albuterol (PROVENTIL HFA;VENTOLIN HFA) 108 (90 Base) MCG/ACT inhaler Inhale 2 puffs into the lungs every 6 (six) hours as needed for wheezing or shortness of breath. (Patient not taking: No sig reported)   Ascorbic Acid (VITAMIN C) 100 MG tablet Take 100 mg by mouth daily. When remembers   bisacodyl (MAGIC BULLETS) 10 MG suppository Place 1 suppository (10 mg total) rectally daily as needed for mild constipation or moderate constipation.   cyclobenzaprine (FLEXERIL) 10 MG  tablet Take 10 mg by mouth 3 (three) times daily as needed.    EPINEPHrine 0.3 mg/0.3 mL IJ SOAJ injection Inject 0.3 mg into the muscle as needed for anaphylaxis.   Ginger, Zingiber officinalis, (GINGER PO) Take by mouth as needed.   ketoconazole (NIZORAL) 2 % shampoo Apply 1 application topically 2 (two) times a week.   midodrine (PROAMATINE) 5 MG tablet TAKE 1 TO 2 TABLETS(5 TO 10 MG) BY MOUTH THREE TIMES DAILY   ondansetron (ZOFRAN) 4 MG tablet Take 1 tablet (4 mg total) by mouth every 8 (eight) hours as needed for nausea or vomiting.   podofilox (CONDYLOX) 0.5 % gel Apply to affected area for 3 days, then stop for 4 days. Repeat for 4 weeks.   No facility-administered medications prior to visit.    Review of Systems  {Labs  Heme  Chem  Endocrine  Serology  Results Review (optional):23779}  Objective    There were no vitals taken for this visit. {Show previous vital signs (optional):23777}  Physical Exam  ***  Last depression screening scores PHQ 2/9 Scores 05/13/2019 04/30/2017 03/12/2017  PHQ - 2 Score 0 4 3  PHQ- 9 Score - 17 13   Last fall risk screening Fall Risk  05/13/2019  Falls in the past year? 0  Number falls in past yr: 0  Injury with Fall? 0   Last Audit-C alcohol use screening Alcohol Use Disorder Test (AUDIT) 05/13/2019  1. How often do you have a drink containing alcohol? 0  2. How many drinks containing alcohol do you have on a typical day when you are drinking? 0  3. How often do you have six or more drinks on one occasion? 0  AUDIT-C Score 0  Alcohol Brief Interventions/Follow-up AUDIT Score <7 follow-up not indicated   A score of 3 or more in women, and 4 or more in men indicates increased risk for alcohol abuse, EXCEPT if all of the points are from question 1   No results found for any visits on 08/24/20.  Assessment & Plan    Routine Health Maintenance and Physical Exam  Exercise Activities and Dietary recommendations  Goals      DIET -  INCREASE WATER INTAKE     Recommend increasing water intake to 4-6 glasses a day.          Immunization History  Administered Date(s) Administered   HPV Quadrivalent 10/20/2009, 01/18/2010, 05/25/2010   Hepatitis A 10/20/2009, 05/25/2010   Meningococcal Conjugate 10/20/2009   Tdap 10/20/2009    Health Maintenance  Topic Date Due   Pneumococcal Vaccine 36-68 Years old (1 - PCV) Never done   HIV Screening  Never done   Hepatitis C Screening  Never done   PAP-Cervical Cytology Screening  06/09/2019  PAP SMEAR-Modifier  06/09/2019   TETANUS/TDAP  10/21/2019   INFLUENZA VACCINE  09/06/2020   HPV VACCINES  Completed    Discussed health benefits of physical activity, and encouraged her to engage in regular exercise appropriate for her age and condition.  ***  No follow-ups on file.     {provider attestation***:1}   Mila Merry, MD  [facility] [phone] (phone) [phone] (fax)  [facility]

## 2020-08-25 DIAGNOSIS — R1011 Right upper quadrant pain: Secondary | ICD-10-CM | POA: Diagnosis not present

## 2020-08-25 DIAGNOSIS — R21 Rash and other nonspecific skin eruption: Secondary | ICD-10-CM | POA: Diagnosis not present

## 2020-08-25 DIAGNOSIS — R1013 Epigastric pain: Secondary | ICD-10-CM | POA: Diagnosis not present

## 2020-08-25 DIAGNOSIS — R5381 Other malaise: Secondary | ICD-10-CM | POA: Diagnosis not present

## 2020-09-02 DIAGNOSIS — N319 Neuromuscular dysfunction of bladder, unspecified: Secondary | ICD-10-CM | POA: Diagnosis not present

## 2020-09-02 DIAGNOSIS — N390 Urinary tract infection, site not specified: Secondary | ICD-10-CM | POA: Diagnosis not present

## 2020-09-03 DIAGNOSIS — R490 Dysphonia: Secondary | ICD-10-CM | POA: Diagnosis not present

## 2020-09-03 DIAGNOSIS — J384 Edema of larynx: Secondary | ICD-10-CM | POA: Diagnosis not present

## 2020-09-03 DIAGNOSIS — R1314 Dysphagia, pharyngoesophageal phase: Secondary | ICD-10-CM | POA: Diagnosis not present

## 2020-09-10 ENCOUNTER — Telehealth: Payer: Self-pay | Admitting: *Deleted

## 2020-09-10 ENCOUNTER — Telehealth: Payer: Self-pay | Admitting: Family Medicine

## 2020-09-10 DIAGNOSIS — Z111 Encounter for screening for respiratory tuberculosis: Secondary | ICD-10-CM

## 2020-09-10 NOTE — Telephone Encounter (Signed)
Jacki Cones calling from [employer] service pt CAP case manager is calling to follow up on faxes that where sent 09/01/20/,09/09/20 Needing Physicans request for service- a yearly form CB- [phone] Fax- [phone]

## 2020-09-10 NOTE — Telephone Encounter (Signed)
Please advise 

## 2020-09-10 NOTE — Telephone Encounter (Signed)
Copied from CRM [id number]. Topic: General - Inquiry >> Sep 10, 2020  2:39 PM Crist Infante wrote: Reason for CRM: pt would like an order for a TB lab draw. Can dr put in an order? 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 Also let her know when form is completed that you received.

## 2020-09-13 NOTE — Telephone Encounter (Signed)
OK to print order for labs. I don't have any forms for her.

## 2020-09-16 NOTE — Telephone Encounter (Signed)
Cassandra Lowe called back to report that she is still missing what she needs from [facility]. She says that the patient was told this would be completed Monday.

## 2020-09-17 DIAGNOSIS — R339 Retention of urine, unspecified: Secondary | ICD-10-CM | POA: Diagnosis not present

## 2020-09-21 NOTE — Telephone Encounter (Signed)
Has this form come through yet?

## 2020-09-21 NOTE — Telephone Encounter (Signed)
Completed and sent to medical records

## 2020-09-21 NOTE — Telephone Encounter (Signed)
Severiano Gilbert will be faxing form again from [facility] need it completed and returned ASAP please

## 2020-09-21 NOTE — Telephone Encounter (Signed)
Yes

## 2020-09-29 ENCOUNTER — Telehealth: Payer: Self-pay

## 2020-09-29 DIAGNOSIS — S14105S Unspecified injury at C5 level of cervical spinal cord, sequela: Secondary | ICD-10-CM

## 2020-09-29 DIAGNOSIS — R252 Cramp and spasm: Secondary | ICD-10-CM

## 2020-09-29 DIAGNOSIS — M21379 Foot drop, unspecified foot: Secondary | ICD-10-CM

## 2020-09-29 DIAGNOSIS — G904 Autonomic dysreflexia: Secondary | ICD-10-CM

## 2020-09-29 DIAGNOSIS — S129XXS Fracture of neck, unspecified, sequela: Secondary | ICD-10-CM

## 2020-09-29 NOTE — Telephone Encounter (Signed)
Copied from CRM [id number]. Topic: Referral - Request for Referral >> Sep 29, 2020 11:50 AM Randol Kern wrote: Has patient seen PCP for this complaint? Yes.   *If NO, is insurance requiring patient see PCP for this issue before PCP can refer them? Referral for which specialty: Rehab Preferred provider/office: Dr. Kaleen Odea with [facility] Fax: [phone] Reason for referral: Foot drop + Spasms

## 2020-09-30 DIAGNOSIS — R339 Retention of urine, unspecified: Secondary | ICD-10-CM | POA: Diagnosis not present

## 2020-09-30 NOTE — Addendum Note (Signed)
Addended by: Malva Limes on: 09/30/2020 07:50 AM   Modules accepted: Orders

## 2020-09-30 NOTE — Telephone Encounter (Signed)
Referral placed.

## 2020-10-06 ENCOUNTER — Telehealth: Payer: Self-pay | Admitting: Family Medicine

## 2020-10-06 NOTE — Telephone Encounter (Signed)
Copied from CRM [id number]. Topic: Referral - Request for Referral >> Sep 29, 2020 11:50 AM Randol Kern wrote: Has patient seen PCP for this complaint? Yes.   *If NO, is insurance requiring patient see PCP for this issue before PCP can refer them? Referral for which specialty: Rehab Preferred provider/office: Dr. Kaleen Odea with [facility] Fax: [phone] Reason for referral: Foot drop + Spasms   Patient called in stating [facility] hasnt received referral, that is in system . Please call back

## 2020-10-06 NOTE — Telephone Encounter (Signed)
Copied from CRM #[id number]. Topic: Referral - Request for Referral >> Sep 29, 2020 11:50 AM Eason, Dominique M wrote: Has patient seen PCP for this complaint? Yes.   *If NO, is insurance requiring patient see PCP for this issue before PCP can refer them? Referral for which specialty: Rehab Preferred provider/office: Dr. Patrick Jay Obrien with [facility] Fax: [phone] Reason for referral: Foot drop + Spasms 

## 2020-10-19 ENCOUNTER — Encounter: Payer: Self-pay | Admitting: Family Medicine

## 2020-10-19 ENCOUNTER — Ambulatory Visit (INDEPENDENT_AMBULATORY_CARE_PROVIDER_SITE_OTHER): Payer: Medicare Other | Admitting: Family Medicine

## 2020-10-19 ENCOUNTER — Other Ambulatory Visit: Payer: Self-pay

## 2020-10-19 VITALS — BP 117/77 | HR 99

## 2020-10-19 DIAGNOSIS — T50Z95A Adverse effect of other vaccines and biological substances, initial encounter: Secondary | ICD-10-CM
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
## 2020-10-19 NOTE — Telephone Encounter (Signed)
Pt was advised that referral has been faxed to Dr Neomia Dear 3 different times. I spoke with Kriste Basque in his office that asked that referral be faxed to her attention.Pt advised that this was done

## 2020-10-20 ENCOUNTER — Telehealth: Payer: Self-pay | Admitting: Family Medicine

## 2020-10-20 NOTE — Telephone Encounter (Signed)
Pt calling in and is requesting to have PCP place new orders for her incontinence supplies. She states that she is needing to have reusable under pads, pre moistened wash cloths, incontinence pads added to her supply list. She states that she is needing to have orders faxed to [facility]. She states that she would like to have a call once this is done. Please advise.    [place] Fax# [phone]  Callback # [phone]

## 2020-10-21 LAB — QUANTIFERON-TB GOLD PLUS

## 2020-11-02 DIAGNOSIS — Z91040 Latex allergy status: Secondary | ICD-10-CM | POA: Diagnosis not present

## 2020-11-02 DIAGNOSIS — Z79899 Other long term (current) drug therapy: Secondary | ICD-10-CM | POA: Diagnosis not present

## 2020-11-02 DIAGNOSIS — N319 Neuromuscular dysfunction of bladder, unspecified: Secondary | ICD-10-CM | POA: Diagnosis not present

## 2020-11-02 DIAGNOSIS — Z882 Allergy status to sulfonamides status: Secondary | ICD-10-CM | POA: Diagnosis not present

## 2020-11-02 DIAGNOSIS — N3289 Other specified disorders of bladder: Secondary | ICD-10-CM | POA: Diagnosis not present

## 2020-11-02 DIAGNOSIS — G709 Myoneural disorder, unspecified: Secondary | ICD-10-CM | POA: Diagnosis not present

## 2020-11-02 DIAGNOSIS — Z885 Allergy status to narcotic agent status: Secondary | ICD-10-CM | POA: Diagnosis not present

## 2020-11-04 DIAGNOSIS — G8254 Quadriplegia, C5-C7 incomplete: Secondary | ICD-10-CM | POA: Diagnosis not present

## 2020-11-17 DIAGNOSIS — G8254 Quadriplegia, C5-C7 incomplete: Secondary | ICD-10-CM | POA: Diagnosis not present

## 2020-11-23 ENCOUNTER — Telehealth: Payer: Self-pay

## 2020-11-23 NOTE — Telephone Encounter (Signed)
Copied from CRM [id number]. Topic: General - Other >> Nov 23, 2020 11:07 AM Pawlus, Maxine Glenn A wrote: Reason for CRM: Pt called in requested an exemption letter from the flu shot and Covid vaccine for school, pt requested a call back.

## 2020-11-24 DIAGNOSIS — N319 Neuromuscular dysfunction of bladder, unspecified: Secondary | ICD-10-CM | POA: Diagnosis not present

## 2020-12-03 ENCOUNTER — Other Ambulatory Visit: Payer: Self-pay | Admitting: Family Medicine

## 2020-12-04 NOTE — Telephone Encounter (Signed)
Requested medication (s) are due for refill today:yes  Requested medication (s) are on the active medication list: yes  Last refill: 06/17/20  #180  4 refills  Future visit scheduled no  Notes to clinic: not delegated  Requested Prescriptions  Pending Prescriptions Disp Refills   midodrine (PROAMATINE) 5 MG tablet [Pharmacy Med Name: MIDODRINE 5MG  TABLETS] 180 tablet 4    Sig: TAKE 2 TABLETS(10 MG) BY MOUTH THREE TIMES DAILY AS NEEDED     Not Delegated - Cardiovascular: Midodrine Failed - 12/03/2020  6:51 PM      Failed - This refill cannot be delegated      Passed - Last BP in normal range    BP Readings from Last 1 Encounters:  10/19/20 117/77          Passed - Valid encounter within last 12 months    Recent Outpatient Visits           1 month ago Adverse effect of vaccine, initial encounter   [facility], MD   9 months ago Quadriplegia [facility])   [facility], MD   11 months ago Pruritus of scalp   [facility], MD   1 year ago Diarrhea, unspecified type   [facility], [facility], PA-C   1 year ago Encounter for other administrative examinations   [facility], Sherrie Mustache, MD

## 2020-12-06 ENCOUNTER — Other Ambulatory Visit: Payer: Self-pay | Admitting: *Deleted

## 2020-12-06 ENCOUNTER — Telehealth: Payer: Self-pay

## 2020-12-06 DIAGNOSIS — Z111 Encounter for screening for respiratory tuberculosis: Secondary | ICD-10-CM

## 2020-12-06 NOTE — Telephone Encounter (Signed)
Lab ordered.

## 2020-12-06 NOTE — Telephone Encounter (Signed)
Patient's Quantiferon sample was hemolyzed and could not be tested.  We did not receive notification from [facility] regarding this. She called today for results and I had Cherrie to call for them.   Copy of results is coming back on a green folder which states the sample was hemolyzed.  Please place another order for her so she can come tomorrow to get this for college.   Call when ready please.

## 2020-12-08 LAB — HM HEPATITIS C SCREENING LAB: HM Hepatitis Screen: NEGATIVE

## 2020-12-20 DIAGNOSIS — R339 Retention of urine, unspecified: Secondary | ICD-10-CM | POA: Diagnosis not present

## 2020-12-21 ENCOUNTER — Encounter: Payer: Self-pay | Admitting: Family Medicine

## 2020-12-22 DIAGNOSIS — Z435 Encounter for attention to cystostomy: Secondary | ICD-10-CM | POA: Diagnosis not present

## 2020-12-22 DIAGNOSIS — N319 Neuromuscular dysfunction of bladder, unspecified: Secondary | ICD-10-CM | POA: Diagnosis not present

## 2020-12-22 DIAGNOSIS — G8254 Quadriplegia, C5-C7 incomplete: Secondary | ICD-10-CM | POA: Diagnosis not present

## 2020-12-28 LAB — QUANTIFERON-TB GOLD PLUS
QuantiFERON Mitogen Value: >10 IU/mL
QuantiFERON Nil Value: 0.05 IU/mL
QuantiFERON TB1 Ag Value: 0.05 IU/mL
QuantiFERON TB2 Ag Value: 0.05 IU/mL
QuantiFERON-TB Gold Plus: NEGATIVE

## 2021-01-05 ENCOUNTER — Telehealth: Payer: Self-pay | Admitting: Family Medicine

## 2021-01-05 NOTE — Telephone Encounter (Signed)
Copied from CRM [id number]. Topic: Medicare AWV >> Jan 05, 2021  2:23 PM Claudette Laws R wrote: Reason for CRM: Left message for patient to call back and schedule Medicare Annual Wellness Visit (AWV) in office.   If not able to come in office, please offer to do virtually or by telephone.   Last AWV: 05/13/2019  Please schedule at anytime with [facility] Health Advisor.  If any questions, please contact me at [phone]

## 2021-01-12 DIAGNOSIS — G8254 Quadriplegia, C5-C7 incomplete: Secondary | ICD-10-CM | POA: Diagnosis not present

## 2021-01-12 DIAGNOSIS — Z741 Need for assistance with personal care: Secondary | ICD-10-CM | POA: Diagnosis not present

## 2021-01-12 DIAGNOSIS — M6281 Muscle weakness (generalized): Secondary | ICD-10-CM | POA: Diagnosis not present

## 2021-01-12 DIAGNOSIS — M2560 Stiffness of unspecified joint, not elsewhere classified: Secondary | ICD-10-CM | POA: Diagnosis not present

## 2021-01-12 DIAGNOSIS — Z789 Other specified health status: Secondary | ICD-10-CM | POA: Diagnosis not present

## 2021-01-12 DIAGNOSIS — Z7409 Other reduced mobility: Secondary | ICD-10-CM | POA: Diagnosis not present

## 2021-01-12 DIAGNOSIS — R278 Other lack of coordination: Secondary | ICD-10-CM | POA: Diagnosis not present

## 2021-01-24 DIAGNOSIS — Z741 Need for assistance with personal care: Secondary | ICD-10-CM | POA: Diagnosis not present

## 2021-01-24 DIAGNOSIS — G8254 Quadriplegia, C5-C7 incomplete: Secondary | ICD-10-CM | POA: Diagnosis not present

## 2021-01-24 DIAGNOSIS — M6281 Muscle weakness (generalized): Secondary | ICD-10-CM | POA: Diagnosis not present

## 2021-01-24 DIAGNOSIS — Z789 Other specified health status: Secondary | ICD-10-CM | POA: Diagnosis not present

## 2021-01-24 DIAGNOSIS — M2560 Stiffness of unspecified joint, not elsewhere classified: Secondary | ICD-10-CM | POA: Diagnosis not present

## 2021-01-24 DIAGNOSIS — R278 Other lack of coordination: Secondary | ICD-10-CM | POA: Diagnosis not present

## 2021-01-24 DIAGNOSIS — Z7409 Other reduced mobility: Secondary | ICD-10-CM | POA: Diagnosis not present

## 2021-01-26 DIAGNOSIS — N319 Neuromuscular dysfunction of bladder, unspecified: Secondary | ICD-10-CM | POA: Diagnosis not present

## 2021-02-10 DIAGNOSIS — Z789 Other specified health status: Secondary | ICD-10-CM | POA: Diagnosis not present

## 2021-02-10 DIAGNOSIS — Z741 Need for assistance with personal care: Secondary | ICD-10-CM | POA: Diagnosis not present

## 2021-02-10 DIAGNOSIS — G8254 Quadriplegia, C5-C7 incomplete: Secondary | ICD-10-CM | POA: Diagnosis not present

## 2021-02-10 DIAGNOSIS — Z7409 Other reduced mobility: Secondary | ICD-10-CM | POA: Diagnosis not present

## 2021-02-10 DIAGNOSIS — M6281 Muscle weakness (generalized): Secondary | ICD-10-CM | POA: Diagnosis not present

## 2021-02-10 DIAGNOSIS — R278 Other lack of coordination: Secondary | ICD-10-CM | POA: Diagnosis not present

## 2021-02-10 DIAGNOSIS — M2560 Stiffness of unspecified joint, not elsewhere classified: Secondary | ICD-10-CM | POA: Diagnosis not present

## 2021-02-12 DIAGNOSIS — R339 Retention of urine, unspecified: Secondary | ICD-10-CM | POA: Diagnosis not present

## 2021-02-25 DIAGNOSIS — R339 Retention of urine, unspecified: Secondary | ICD-10-CM | POA: Diagnosis not present

## 2021-02-25 DIAGNOSIS — Z435 Encounter for attention to cystostomy: Secondary | ICD-10-CM | POA: Diagnosis not present

## 2021-02-25 DIAGNOSIS — N390 Urinary tract infection, site not specified: Secondary | ICD-10-CM | POA: Diagnosis not present

## 2021-03-02 DIAGNOSIS — Z789 Other specified health status: Secondary | ICD-10-CM | POA: Diagnosis not present

## 2021-03-02 DIAGNOSIS — B081 Molluscum contagiosum: Secondary | ICD-10-CM | POA: Diagnosis not present

## 2021-03-02 DIAGNOSIS — L538 Other specified erythematous conditions: Secondary | ICD-10-CM | POA: Diagnosis not present

## 2021-03-15 DIAGNOSIS — R339 Retention of urine, unspecified: Secondary | ICD-10-CM | POA: Diagnosis not present

## 2021-03-23 DIAGNOSIS — G8254 Quadriplegia, C5-C7 incomplete: Secondary | ICD-10-CM | POA: Diagnosis not present

## 2021-03-29 ENCOUNTER — Ambulatory Visit: Payer: Self-pay | Admitting: *Deleted

## 2021-03-29 DIAGNOSIS — R399 Unspecified symptoms and signs involving the genitourinary system: Secondary | ICD-10-CM

## 2021-03-29 NOTE — Telephone Encounter (Signed)
Ordered fron [facility] is in. Per patient she is coming in tomorrow to drop off the urine. The order from [facility] is on Roshena's desk.

## 2021-03-29 NOTE — Telephone Encounter (Signed)
Summary: Patient need to come in and have urine tested   Patient called I to inquire of an Order that was sent from [facility] was sent last week and was not there when she came in yesterday to leave a urine sample. Patient have an upcoming procedure a bladder Botox coming up and need to have the urine checked so that if she need to have medication she can get it. Patient drove 30 minutes on 03/28/21 and was not able to leave her sample asking if Dr Esmond Camper nurse can please find the order and call her to come in and have this done please so she does not have to cancel her procedure. Please call patient at Ph# [phone]       Chief Complaint: requesting urine to be checked prior to procedure.  Symptoms: bladder spasms, body spasms, cloudy urine. Suprapubic catheter change yesterday and patient has fresh sample in her refrigerator to submit.  Frequency: urinary symptoms x approx. 2 weeks  Pertinent Negatives: Patient denies fever Disposition: [] ED /[] Urgent Care (no appt availability in office) / [x] Appointment(In office/virtual)/ []  [place] [facility]/ [] Home Care/ [x] Refused Recommended Disposition /[] [place] [facility]/ []  Follow-up with PCP Additional Notes:   Patient unable to make appt today due to child care and transportation issues. Requesting PCP to order urinalysis to check urine if she needs antibiotics prior to Botox procedure March 6. Reports Dr. from [facility] sent request for order of urine. Patient request PCP to share results with Dr. , urology. Please advise travel time for patient will be 30 minutes. Patient would like a call back prior to 3p to arrange transportation.       Reason for Disposition  [1] Cloudy urine lasts > 24 hours AND [2] not cleared by increasing fluid intake  Answer Assessment - Initial Assessment Questions 1. MAIN CONCERN OR SYMPTOM:  "What is your main concern right now?" (e.g., blood in urine, urine blocked, can't cath).      Requesting urine check due to bladder spasms, body spasms, and cloudy urine from suprapubic catheter  2. ONSET: "When did the  symptoms   start?"     Approx 2 weeks ago  3. BLADDER MANAGEMENT PROGRAM: "What is your bladder management program?"    - Fluid Intake?    - Intermittent Catheter (In and Out)?    - In-dwelling Catheter (urethra, suprapubic)     Suprapubic catheter  4. FEVER: "Is there a fever?" If Yes, ask: "What is the temperature, how was it measured, and when did it start?"     no 5. URINE COLOR: "What color is the urine?"  "Is there blood present in the urine?" (e.g., clear, yellow, cloudy, tea-colored, blood streaks, bright red)     Cloudy  6. ONSET: "When was the catheter inserted (last changed)?"     Yesterday  7. OTHER SYMPTOMS: "Do you have any other symptoms?" (e.g., fever, back pain, bad urine odor, headache, sweating, goosebumps)     Body / bladder spams 8. SPINAL CORD INJURY: "What is your spinal injury level?" (e.g., C1-7, T-12)     na  Protocols used: Spinal Cord Injury - Urinary Catheter Symptoms and Questions-A-AH

## 2021-03-30 ENCOUNTER — Other Ambulatory Visit: Payer: Self-pay | Admitting: Family Medicine

## 2021-03-30 NOTE — Telephone Encounter (Signed)
Orders from [facility] received on 03/29/2021 at 9:26am per fax time stamp. Order for urine culture pending in this encounter. I called patient to confirm that she was still going to bring in a urine sample. Patient states she will bring the sample by 3pm today.

## 2021-03-30 NOTE — Telephone Encounter (Signed)
Urine sample dropped off just now by patients husband Cassandra Lowe. Urine culture drawn up and sent out to [facility]. Results need to be faxed to [facility]. Fax # [phone] and [phone]. Phone number to urology is [phone] or [phone]

## 2021-04-05 ENCOUNTER — Encounter: Payer: Self-pay | Admitting: Family Medicine

## 2021-04-06 ENCOUNTER — Other Ambulatory Visit: Payer: Self-pay

## 2021-04-06 ENCOUNTER — Telehealth: Payer: Self-pay

## 2021-04-06 DIAGNOSIS — R110 Nausea: Secondary | ICD-10-CM

## 2021-04-06 LAB — URINE CULTURE

## 2021-04-06 MED ORDER — CEFDINIR 300 MG PO CAPS: 300.0000 mg | ORAL_CAPSULE | Freq: Two times a day (BID) | ORAL | 0 refills | Status: AC

## 2021-04-06 NOTE — Telephone Encounter (Signed)
Please see result note. Need to start cefdinir 300mg  two daily for 7 days.  ?

## 2021-04-06 NOTE — Telephone Encounter (Signed)
Patient advised by CMA Maye Hides. See result note. ?

## 2021-04-06 NOTE — Telephone Encounter (Signed)
Patient is requesting a refill on Zofran. She has a UTI and will start a course of antibiotics today. Patient says that she gets nauseous whenever she takes antibiotics and would like to have this medication to tolerate treatment. Please advise.  ? ?Pharmacy: [facility] [place] [place]) ?

## 2021-04-06 NOTE — Telephone Encounter (Signed)
Copied from [place] [id number]. Topic: General - Other ?>> Apr 06, 2021 11:43 AM McGill, Nelva Bush wrote: ?Reason for CRM: Pt requesting a call back in regards to her urinalysis results. Pt is urgently requesting a call back with results stated her procedure is this upcoming Monday morning and she needs to start an antibiotic before procedure. ?

## 2021-04-07 LAB — URINE CULTURE

## 2021-04-07 MED ORDER — ONDANSETRON HCL 4 MG PO TABS: 4.0000 mg | ORAL_TABLET | Freq: Three times a day (TID) | ORAL | 1 refills | Status: DC | PRN

## 2021-04-11 DIAGNOSIS — Z885 Allergy status to narcotic agent status: Secondary | ICD-10-CM | POA: Diagnosis not present

## 2021-04-11 DIAGNOSIS — I129 Hypertensive chronic kidney disease with stage 1 through stage 4 chronic kidney disease, or unspecified chronic kidney disease: Secondary | ICD-10-CM | POA: Diagnosis not present

## 2021-04-11 DIAGNOSIS — Z466 Encounter for fitting and adjustment of urinary device: Secondary | ICD-10-CM | POA: Diagnosis not present

## 2021-04-11 DIAGNOSIS — N189 Chronic kidney disease, unspecified: Secondary | ICD-10-CM | POA: Diagnosis not present

## 2021-04-11 DIAGNOSIS — Z91040 Latex allergy status: Secondary | ICD-10-CM | POA: Diagnosis not present

## 2021-04-11 DIAGNOSIS — Z888 Allergy status to other drugs, medicaments and biological substances status: Secondary | ICD-10-CM | POA: Diagnosis not present

## 2021-04-11 DIAGNOSIS — S14106A Unspecified injury at C6 level of cervical spinal cord, initial encounter: Secondary | ICD-10-CM | POA: Diagnosis not present

## 2021-04-11 DIAGNOSIS — Z882 Allergy status to sulfonamides status: Secondary | ICD-10-CM | POA: Diagnosis not present

## 2021-04-11 DIAGNOSIS — N3010 Interstitial cystitis (chronic) without hematuria: Secondary | ICD-10-CM | POA: Diagnosis not present

## 2021-04-11 DIAGNOSIS — N319 Neuromuscular dysfunction of bladder, unspecified: Secondary | ICD-10-CM | POA: Diagnosis not present

## 2021-04-21 DIAGNOSIS — R339 Retention of urine, unspecified: Secondary | ICD-10-CM | POA: Diagnosis not present

## 2021-05-10 DIAGNOSIS — Z466 Encounter for fitting and adjustment of urinary device: Secondary | ICD-10-CM | POA: Diagnosis not present

## 2021-05-10 DIAGNOSIS — N319 Neuromuscular dysfunction of bladder, unspecified: Secondary | ICD-10-CM | POA: Diagnosis not present

## 2021-05-18 ENCOUNTER — Telehealth: Payer: Self-pay | Admitting: Family Medicine

## 2021-05-18 NOTE — Telephone Encounter (Signed)
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 She has been given call back number and she will call to schedule when she is sure of her schedule. ?

## 2021-05-18 NOTE — Telephone Encounter (Signed)
Copied from CRM [id number]. Topic: Medicare AWV ?>> May 18, 2021  2:01 PM Claudette Laws R wrote: ?Reason for CRM:  ?No answer unable to leave a message for patient to call back and schedule Medicare Annual Wellness Visit (AWV) in office.  ? ?If unable to come into the office for AWV,  please offer to do virtually or by telephone. ? ?Last AWV: 05/13/2019 ? ?Please schedule at anytime with [facility] Health Advisor. ? ?30 minute appointment for Virtual or phone ?45 minute appointment for in office or Initial virtual/phone ? ?Any questions, please contact me at [phone] ?

## 2021-05-23 DIAGNOSIS — R339 Retention of urine, unspecified: Secondary | ICD-10-CM | POA: Diagnosis not present

## 2021-05-25 DIAGNOSIS — N319 Neuromuscular dysfunction of bladder, unspecified: Secondary | ICD-10-CM | POA: Diagnosis not present

## 2021-06-13 ENCOUNTER — Other Ambulatory Visit: Payer: Self-pay | Admitting: Family Medicine

## 2021-06-13 ENCOUNTER — Ambulatory Visit (INDEPENDENT_AMBULATORY_CARE_PROVIDER_SITE_OTHER): Payer: Medicare Other

## 2021-06-13 VITALS — Wt 88.0 lb

## 2021-06-13 DIAGNOSIS — Z0000 Encounter for general adult medical examination without abnormal findings: Secondary | ICD-10-CM | POA: Diagnosis not present

## 2021-06-13 NOTE — Progress Notes (Signed)
?Virtual Visit via Telephone Note ? ?I connected with  Cassandra Lowe on 06/13/21 at  1:00 PM EDT by telephone and verified that I am speaking with the correct person using two identifiers. ? ?Location: ?Patient: home ?Provider: [facility] ?Persons participating in the virtual visit: patient/Nurse Health Advisor ?  ?I discussed the limitations, risks, security and privacy concerns of performing an evaluation and management service by telephone and the availability of in person appointments. The patient expressed understanding and agreed to proceed. ? ?Interactive audio and video telecommunications were attempted between this nurse and patient, however failed, due to patient having technical difficulties OR patient did not have access to video capability.  We continued and completed visit with audio only. ? ?Some vital signs may be absent or patient reported.  ? ?Dionisio David, LPN ? ?Subjective:  ? Cassandra Lowe is a 31 y.o. female who presents for Medicare Annual (Subsequent) preventive examination. ? ?Review of Systems    ? ?  ? ?   ?Objective:  ?  ?There were no vitals filed for this visit. ?There is no height or weight on file to calculate BMI. ? ? ?  05/13/2019  ? 10:27 AM 04/30/2017  ?  5:32 PM 03/12/2017  ?  2:04 PM 07/24/2015  ?  4:04 PM  ?Advanced Directives  ?Does Patient Have a Medical Advance Directive? Yes No No No  ?Type of Paramedic of [place];Living will     ?Copy of [place] in Chart? No - copy requested     ?Would patient like information on creating a medical advance directive?  Yes (MAU/Ambulatory/Procedural Areas - Information given) Yes (MAU/Ambulatory/Procedural Areas - Information given) No - patient declined information  ? ? ?Current Medications (verified) ?Outpatient Encounter Medications as of 06/13/2021  ?Medication Sig  ? cephALEXin (KEFLEX) 500 MG capsule Take by mouth.  ? cyclobenzaprine (FLEXERIL) 10 MG tablet 1 tablet (10 mg total) as  needed.  ? diclofenac (VOLTAREN) 75 MG EC tablet 1 tablet (75 mg total) 2 (two)  times daily as needed.  ? EPINEPHrine 0.3 mg/0.3 mL IJ SOAJ injection Inject into the muscle.  ? fluconazole (DIFLUCAN) 150 MG tablet   ? fosfomycin (MONUROL) 3 g PACK Take 1 packet every other day  ? ibuprofen (ADVIL) 600 MG tablet   ? nitrofurantoin, macrocrystal-monohydrate, (MACROBID) 100 MG capsule Take by mouth.  ? norethindrone (MICRONOR) 0.35 MG tablet Take 1 tablet by mouth daily.  ? oxybutynin (DITROPAN) 5 MG tablet Take by mouth.  ? sodium chloride 0.9 % injection Inject into the vein.  ? sodium chloride irrigation 0.9 % irrigation Irrigate with as directed.  ? sulfamethoxazole-trimethoprim (BACTRIM DS) 800-160 MG tablet Take 1 tablet by mouth 2 (two) times daily with a meal.  ? traMADol (ULTRAM) 50 MG tablet Take by mouth.  ? trospium (SANCTURA) 20 MG tablet Take by mouth.  ? albuterol (PROVENTIL HFA;VENTOLIN HFA) 108 (90 Base) MCG/ACT inhaler Inhale 2 puffs into the lungs every 6 (six) hours as needed for wheezing or shortness of breath.  ? amoxicillin-clavulanate (AUGMENTIN) 875-125 MG tablet Take 1 tablet by mouth 2 (two) times daily.  ? Ascorbic Acid (VITAMIN C) 100 MG tablet Take 100 mg by mouth daily. When remembers  ? bisacodyl (MAGIC BULLETS) 10 MG suppository Place 1 suppository (10 mg total) rectally daily as needed for mild constipation or moderate constipation.  ? cyclobenzaprine (FLEXERIL) 10 MG tablet Take 10 mg by mouth 3 (three) times daily as needed.   ?  doxycycline (VIBRAMYCIN) 100 MG capsule Take 100 mg by mouth 2 (two) times daily.  ? EPINEPHrine 0.3 mg/0.3 mL IJ SOAJ injection Inject 0.3 mg into the muscle as needed for anaphylaxis.  ? Fluocinolone Acetonide Scalp 0.01 % OIL Apply topically daily.  ? fosfomycin (MONUROL) 3 g PACK SMARTSIG:1 Packet(s) By Mouth Every Other Day  ? Ginger, Zingiber officinalis, (GINGER PO) Take by mouth as needed.  ? ketoconazole (NIZORAL) 2 % shampoo Apply 1 application  topically 2 (two) times a week.  ? midodrine (PROAMATINE) 5 MG tablet TAKE 2 TABLETS(10 MG) BY MOUTH THREE TIMES DAILY AS NEEDED  ? midodrine (PROAMATINE) 5 MG tablet Take by mouth.  ? Multiple Vitamin (MULTI-VITAMIN) tablet Take 1 tablet by mouth daily.  ? MYRBETRIQ 25 MG TB24 tablet Take by mouth.  ? Omega-3 1000 MG CAPS Take by mouth.  ? ondansetron (ZOFRAN) 4 MG tablet Take 1 tablet (4 mg total) by mouth every 8 (eight) hours as needed for nausea or vomiting.  ? podofilox (CONDYLOX) 0.5 % gel Apply to affected area for 3 days, then stop for 4 days. Repeat for 4 weeks.  ? Probiotic Product (MAGE) CPDR Take 1 capsule by mouth daily.  ? saccharomyces boulardii (FLORASTOR) 250 MG capsule Take by mouth.  ? ?No facility-administered encounter medications on file as of 06/13/2021.  ? ? ?Allergies (verified) ?Sulfa antibiotics, Sulfamethoxazole-trimethoprim, Nitrofurantoin, Baclofen, Morphine and related, Oxycodone, Tape, Latex, and Tramadol  ? ?History: ?Past Medical History:  ?Diagnosis Date  ? Burst fracture of cervical vertebra (HCC)   ? History of chicken pox   ? Quadriplegia ([place])   ? ?Past Surgical History:  ?Procedure Laterality Date  ? Bladder botox    ? C4-C6 cervical fusion with Decompression or C5 burst fracture  02/12/2014  ? MOUTH SURGERY    ? one tooth removed  ? Reversal of Tracheostomy  03/22/2014  ? TRACHEOSTOMY  02/26/2014  ? 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 ?Family History  ?Problem Relation Age of Onset  ? Ovarian cysts Mother   ? Dysmenorrhea Mother   ? Diabetes Mother   ?     type 2; having complications  ? Hypertension Mother   ? Bipolar disorder Brother   ? Schizophrenia Brother   ? Hypertension Other   ? Diabetes Other   ? ?Social History  ? ?Socioeconomic History  ? Marital status: Single  ?  Spouse name: Not on file  ? Number of children: 1  ? Years of education: Not on file  ? Highest education level: Bachelor's degree (e.g., BA,  AB, BS)  ?Occupational History  ? Occupation: disabled  ?Tobacco Use  ? Smoking status: Never  ? Smokeless tobacco: Never  ?Vaping Use  ? Vaping Use: Never used  ?Substance and Sexual Activity  ? Alcohol use: No  ?  Alcohol/week: 0.0 standard drinks  ? Drug use: No  ? Sexual activity: Not on file  ?  Comment: sexually active at 31 years of age; has had one sexual partner. Used condoms  ?Other Topics Concern  ? Not on file  ?Social History Narrative  ? Not on file  ? ?Social Determinants of Health  ? ?Financial Resource Strain: Not on file  ?Food Insecurity: Not on file  ?Transportation Needs: Not on file  ?Physical Activity: Not on file  ?Stress: Not on file  ?Social Connections: Not on file  ? ? ?Tobacco Counseling ?Counseling given: Not Answered ? ? ?Clinical Intake: ? ?  Pre-visit preparation completed: Yes ? ?Pain : No/denies pain ? ?  ? ?Nutritional Risks: None ?Diabetes: No ? ?How often do you need to have someone help you when you read instructions, pamphlets, or other written materials from your doctor or pharmacy?: 1 - Never ? ?Diabetic?no ? ? ?Interpreter Needed?: No ? ?Information entered by :: Kirke Shaggy, LPN ? ? ?Activities of Daily Living ? ?  10/19/2020  ? 10:49 AM  ?In your present state of health, do you have any difficulty performing the following activities:  ?Hearing? 0  ?Vision? 0  ?Difficulty concentrating or making decisions? 0  ?Walking or climbing stairs? 1  ?Dressing or bathing? 0  ?Doing errands, shopping? 0  ? ? ?Patient Care Team: ?Birdie Sons, MD as PCP - General (Family Medicine) ?Risa Grill, MD as Referring Physician (Urology) ?C[place], Elnora Morrison, MD as Referring Physician (Physical Medicine and Rehabilitation) ? ?Indicate any recent Medical Services you may have received from other than [facility] providers in the past year (date may be approximate). ? ?   ?Assessment:  ? This is a routine wellness examination for [place]. ? ?Hearing/Vision screen ?No results  found. ? ?Dietary issues and exercise activities discussed: ?  ? ? Goals Addressed   ?None ?  ? ?Depression Screen ? ?  10/19/2020  ? 10:48 AM 05/13/2019  ? 10:24 AM 04/30/2017  ?  4:22 PM 03/12/2017  ?  2:05 PM  ?PHQ 2/9 [place]

## 2021-06-13 NOTE — Patient Instructions (Signed)
Preventive Care 21-29 Years Old, Female ?Preventive care refers to lifestyle choices and visits with your health care provider that can promote health and wellness. Preventive care visits are also called wellness exams. ?What can I expect for my preventive care visit? ?Counseling ?During your preventive care visit, your health care provider may ask about your: ?Medical history, including: ?Past medical problems. ?Family medical history. ?Pregnancy history. ?Current health, including: ?Menstrual cycle. ?Method of birth control. ?Emotional well-being. ?Home life and relationship well-being. ?Sexual activity and sexual health. ?Lifestyle, including: ?Alcohol, nicotine or tobacco, and drug use. ?Access to firearms. ?Diet, exercise, and sleep habits. ?Work and work environment. ?Sunscreen use. ?Safety issues such as seatbelt and bike helmet use. ?Physical exam ?Your health care provider may check your: ?Height and weight. These may be used to calculate your BMI (body mass index). BMI is a measurement that tells if you are at a healthy weight. ?Waist circumference. This measures the distance around your waistline. This measurement also tells if you are at a healthy weight and may help predict your risk of certain diseases, such as type 2 diabetes and high blood pressure. ?Heart rate and blood pressure. ?Body temperature. ?Skin for abnormal spots. ?What immunizations do I need? ? ?Vaccines are usually given at various ages, according to a schedule. Your health care provider will recommend vaccines for you based on your age, medical history, and lifestyle or other factors, such as travel or where you work. ?What tests do I need? ?Screening ?Your health care provider may recommend screening tests for certain conditions. This may include: ?Pelvic exam and Pap test. ?Lipid and cholesterol levels. ?Diabetes screening. This is done by checking your blood sugar (glucose) after you have not eaten for a while (fasting). ?Hepatitis  B test. ?Hepatitis C test. ?HIV (human immunodeficiency virus) test. ?STI (sexually transmitted infection) testing, if you are at risk. ?BRCA-related cancer screening. This may be done if you have a family history of breast, ovarian, tubal, or peritoneal cancers. ?Talk with your health care provider about your test results, treatment options, and if necessary, the need for more tests. ?Follow these instructions at home: ?Eating and drinking ? ?Eat a healthy diet that includes fresh fruits and vegetables, whole grains, lean protein, and low-fat dairy products. ?Take vitamin and mineral supplements as recommended by your health care provider. ?Do not drink alcohol if: ?Your health care provider tells you not to drink. ?You are pregnant, may be pregnant, or are planning to become pregnant. ?If you drink alcohol: ?Limit how much you have to 0-1 drink a day. ?Know how much alcohol is in your drink. In the [place] one drink equals one 12 oz bottle of beer (355 mL), one 5 oz glass of wine (148 mL), or one 1? oz glass of hard liquor (44 mL). ?Lifestyle ?Brush your teeth every morning and night with fluoride toothpaste. Floss one time each day. ?Exercise for at least 30 minutes 5 or more days each week. ?Do not use any products that contain nicotine or tobacco. These products include cigarettes, chewing tobacco, and vaping devices, such as e-cigarettes. If you need help quitting, ask your health care provider. ?Do not use drugs. ?If you are sexually active, practice safe sex. Use a condom or other form of protection to prevent STIs. ?If you do not wish to become pregnant, use a form of birth control. If you plan to become pregnant, see your health care provider for a prepregnancy visit. ?Find healthy ways to manage stress, such as: ?Meditation,   yoga, or listening to music. ?Journaling. ?Talking to a trusted person. ?Spending time with friends and family. ?Minimize exposure to UV radiation to reduce your risk of skin  cancer. ?Safety ?Always wear your seat belt while driving or riding in a vehicle. ?Do not drive: ?If you have been drinking alcohol. Do not ride with someone who has been drinking. ?If you have been using any mind-altering substances or drugs. ?While texting. ?When you are tired or distracted. ?Wear a helmet and other protective equipment during sports activities. ?If you have firearms in your house, make sure you follow all gun safety procedures. ?Seek help if you have been physically or sexually abused. ?What's next? ?Go to your health care provider once a year for an annual wellness visit. ?Ask your health care provider how often you should have your eyes and teeth checked. ?Stay up to date on all vaccines. ?This information is not intended to replace advice given to you by your health care provider. Make sure you discuss any questions you have with your health care provider. ?Document Revised: 07/21/2020 Document Reviewed: 07/21/2020 ?Elsevier Patient Education ? [place]. ? ?

## 2021-06-13 NOTE — Telephone Encounter (Signed)
Medication Refill - Medication: midodrine (PROAMATINE) 5 MG tablet ? ?Pt called and reports that she has lost her current supply. She needs a new prescription covered by insurance because she needs this Rx to get out of bed. Says she cannot afford this Rx unless insurance covers it.  ? ? ?Has the patient contacted their pharmacy? Yes.   ?(Agent: If no, request that the patient contact the pharmacy for the refill. If patient does not wish to contact the pharmacy document the reason why and proceed with request.) ?(Agent: If yes, when and what did the pharmacy advise?) ? ?Preferred Pharmacy (with phone number or street name):  ?[id number] [place] Otto, [place] - [phone] AT [facility] OF [facility] & [address]  ?[address] [place] [address]  ?Phone: [phone] Fax: [phone]  ? ?Has the patient been seen for an appointment in the last year OR does the patient have an upcoming appointment? Yes.   ? ?Agent: Please be advised that RX refills may take up to 3 business days. We ask that you follow-up with your pharmacy. ? ?

## 2021-06-14 NOTE — Telephone Encounter (Signed)
Requested medication (s) are due for refill today:   Provider to review. ? ?Requested medication (s) are on the active medication list:   Yes ? ?Future visit scheduled:   No   Seen 7 mo. ago ? ? ?Last ordered: 12/06/2020  #180, 4 refills ? ?Returned because it's a non delegated refill.   Pt. Has lost her current supply and needs a new rx.   She needs it covered by insurance because she can't afford it unless insurance will  pay.   She needs this to get out of bed.   ? ?Requested Prescriptions  ?Pending Prescriptions Disp Refills  ? midodrine (PROAMATINE) 5 MG tablet    ?  Sig: Take by mouth.  ?  ? Not Delegated - Cardiovascular: Midodrine Failed - 06/13/2021  2:30 PM  ?  ?  Failed - This refill cannot be delegated  ?  ?  Failed - Cr in normal range and within 360 days  ?  Creatinine  ?Date Value Ref Range Status  ?02/11/2014 0.83 0.60 - 1.30 mg/dL Final  ? ?Creatinine, Ser  ?Date Value Ref Range Status  ?07/24/2015 0.55 0.44 - 1.00 mg/dL Final  ?  ?  ?  ?  Failed - ALT in normal range and within 360 days  ?  ALT  ?Date Value Ref Range Status  ?07/24/2015 16 14 - 54 U/L Final  ? ?SGPT (ALT)  ?Date Value Ref Range Status  ?02/11/2014 25 U/L Final  ?  Comment:  ?  14-63 ?NOTE: New Reference Range ?08/26/13 ?  ?  ?  ?  ?  Failed - AST in normal range and within 360 days  ?  AST  ?Date Value Ref Range Status  ?07/24/2015 21 15 - 41 U/L Final  ? ?SGOT(AST)  ?Date Value Ref Range Status  ?02/11/2014 30 15 - 37 Unit/L Final  ?  ?  ?  ?  Passed - Last BP in normal range  ?  BP Readings from Last 1 Encounters:  ?10/19/20 117/77  ?  ?  ?  ?  Passed - Valid encounter within last 12 months  ?  Recent Outpatient Visits   ? ?      ? 7 months ago Adverse effect of vaccine, initial encounter  ? [facility] Caryn Section, Kirstie Peri, MD  ? 1 year ago Quadriplegia [facility])  ? [facility] Caryn Section, Kirstie Peri, MD  ? 1 year ago Pruritus of scalp  ? [facility] Caryn Section, Kirstie Peri, MD  ? 1 year ago Diarrhea,  unspecified type  ? [place], PA-C  ? 1 year ago Encounter for other administrative examinations  ? [facility] Caryn Section, Kirstie Peri, MD  ? ?  ?  ? ? ?  ?  ?  ? ?

## 2021-06-15 MED ORDER — MIDODRINE HCL 5 MG PO TABS: 10.0000 mg | ORAL_TABLET | Freq: Three times a day (TID) | ORAL | 1 refills | Status: DC | PRN

## 2021-06-24 DIAGNOSIS — Z466 Encounter for fitting and adjustment of urinary device: Secondary | ICD-10-CM | POA: Diagnosis not present

## 2021-06-24 DIAGNOSIS — N390 Urinary tract infection, site not specified: Secondary | ICD-10-CM | POA: Diagnosis not present

## 2021-06-29 DIAGNOSIS — R339 Retention of urine, unspecified: Secondary | ICD-10-CM | POA: Diagnosis not present

## 2021-06-30 DIAGNOSIS — Z9359 Other cystostomy status: Secondary | ICD-10-CM | POA: Diagnosis not present

## 2021-06-30 DIAGNOSIS — N319 Neuromuscular dysfunction of bladder, unspecified: Secondary | ICD-10-CM | POA: Diagnosis not present

## 2021-06-30 DIAGNOSIS — Z466 Encounter for fitting and adjustment of urinary device: Secondary | ICD-10-CM | POA: Diagnosis not present

## 2021-07-30 DIAGNOSIS — R339 Retention of urine, unspecified: Secondary | ICD-10-CM | POA: Diagnosis not present

## 2021-08-08 DIAGNOSIS — R9349 Abnormal radiologic findings on diagnostic imaging of other urinary organs: Secondary | ICD-10-CM | POA: Diagnosis not present

## 2021-08-29 ENCOUNTER — Telehealth: Payer: Self-pay

## 2021-08-29 NOTE — Telephone Encounter (Signed)
Copied from CRM [id number]. Topic: Referral - Request for Referral >> Aug 26, 2021  4:31 PM Everette C wrote: Has patient seen PCP for this complaint? Yes.   *If NO, is insurance requiring patient see PCP for this issue before PCP can refer them? Referral for which specialty: Imaging  Preferred provider/office: Patient would like to be seen somewhere in the [place] area at a handi-accessible facility  Reason for referral: Patient needs a bone density screening

## 2021-08-29 NOTE — Telephone Encounter (Signed)
I spoke with pt as to the reason she is requesting and states she is a quadriplegic from an accident in 2016 and is advised not to use her standing frame without a recent bone density screening.  States Dr. Sherrie Mustache put in a referral last year and she did not ever make it to appt.

## 2021-08-30 DIAGNOSIS — R339 Retention of urine, unspecified: Secondary | ICD-10-CM | POA: Diagnosis not present

## 2021-08-31 ENCOUNTER — Telehealth: Payer: Self-pay | Admitting: Family Medicine

## 2021-08-31 ENCOUNTER — Other Ambulatory Visit: Payer: Self-pay | Admitting: Family Medicine

## 2021-08-31 NOTE — Telephone Encounter (Signed)
Medication Refill - Medication: midodrine (PROAMATINE) 5 MG tablet  Has the patient contacted their pharmacy? No. Pt requests Rx be sent to a new pharmacy  Preferred Pharmacy (with phone number or street name):  [id number] [place], [place] - [address] Phone:  [phone]  Fax:  [phone]     Has the patient been seen for an appointment in the last year OR does the patient have an upcoming appointment? Yes.    Agent: Please be advised that RX refills may take up to 3 business days. We ask that you follow-up with your pharmacy.

## 2021-09-01 ENCOUNTER — Other Ambulatory Visit: Payer: Self-pay | Admitting: Physician Assistant

## 2021-09-01 DIAGNOSIS — G8250 Quadriplegia, unspecified: Secondary | ICD-10-CM

## 2021-09-01 DIAGNOSIS — S129XXS Fracture of neck, unspecified, sequela: Secondary | ICD-10-CM

## 2021-09-01 MED ORDER — MIDODRINE HCL 5 MG PO TABS: 10.0000 mg | ORAL_TABLET | Freq: Three times a day (TID) | ORAL | 0 refills | Status: DC | PRN

## 2021-09-01 NOTE — Progress Notes (Signed)
Order placed

## 2021-09-01 NOTE — Telephone Encounter (Signed)
Pt informed

## 2021-09-01 NOTE — Telephone Encounter (Signed)
Requested medication (s) are due for refill today: Yes  Requested medication (s) are on the active medication list: Yes  Last refill:  06/15/21  Future visit scheduled: Yes  Notes to clinic:  See request.    Requested Prescriptions  Pending Prescriptions Disp Refills   midodrine (PROAMATINE) 5 MG tablet 180 tablet 1    Sig: Take 2 tablets (10 mg total) by mouth 3 (three) times daily as needed.     Not Delegated - Cardiovascular: Midodrine Failed - 08/31/2021  5:23 PM      Failed - This refill cannot be delegated      Failed - Cr in normal range and within 360 days    Creatinine  Date Value Ref Range Status  02/11/2014 0.83 0.60 - 1.30 mg/dL Final   Creatinine, Ser  Date Value Ref Range Status  07/24/2015 0.55 0.44 - 1.00 mg/dL Final         Failed - ALT in normal range and within 360 days    ALT  Date Value Ref Range Status  07/24/2015 16 14 - 54 U/L Final   SGPT (ALT)  Date Value Ref Range Status  02/11/2014 25 U/L Final    Comment:    14-63 NOTE: New Reference Range 08/26/13          Failed - AST in normal range and within 360 days    AST  Date Value Ref Range Status  07/24/2015 21 15 - 41 U/L Final   SGOT(AST)  Date Value Ref Range Status  02/11/2014 30 15 - 37 Unit/L Final         Passed - Last BP in normal range    BP Readings from Last 1 Encounters:  10/19/20 117/77         Passed - Valid encounter within last 12 months    Recent Outpatient Visits           10 months ago Adverse effect of vaccine, initial encounter   [facility] Malva Limes, MD   1 year ago Quadriplegia [facility])   [facility] Malva Limes, MD   1 year ago Pruritus of scalp   [facility] Malva Limes, MD   2 years ago Diarrhea, unspecified type   [facility] Chrismon, Jodell Cipro, PA-C   2 years ago Encounter for other administrative examinations   [facility] Fisher, Demetrios Isaacs, MD        Future Appointments             In 1 week Fisher, Demetrios Isaacs, MD [facility]

## 2021-09-07 NOTE — Telephone Encounter (Signed)
Pt called requesting to be seen somewhere closer to [facility], pt says she will have a hard time going that far.   Best contact: [phone]

## 2021-09-08 NOTE — Telephone Encounter (Signed)
Cassandra Lowe, Please review.

## 2021-09-13 ENCOUNTER — Telehealth (INDEPENDENT_AMBULATORY_CARE_PROVIDER_SITE_OTHER): Payer: Medicare Other | Admitting: Family Medicine

## 2021-09-13 ENCOUNTER — Telehealth: Payer: Self-pay

## 2021-09-13 DIAGNOSIS — M5441 Lumbago with sciatica, right side: Secondary | ICD-10-CM | POA: Diagnosis not present

## 2021-09-13 DIAGNOSIS — M5442 Lumbago with sciatica, left side: Secondary | ICD-10-CM

## 2021-09-13 DIAGNOSIS — L299 Pruritus, unspecified: Secondary | ICD-10-CM

## 2021-09-13 MED ORDER — KETOCONAZOLE 2 % EX SHAM: 1.0000 | MEDICATED_SHAMPOO | CUTANEOUS | 1 refills | Status: AC

## 2021-09-13 NOTE — Telephone Encounter (Signed)
Please review the form with the patient, I don't know the answers to most of the questions, the form is in basket next to my work bench.

## 2021-09-13 NOTE — Telephone Encounter (Signed)
Forms completed and given to provider to sign.  

## 2021-09-13 NOTE — Progress Notes (Signed)
MyChart Video Visit    Virtual Visit via Video Note   This visit type was conducted due to national recommendations for restrictions regarding the COVID-19 Pandemic (e.g. social distancing) in an effort to limit this patient's exposure and mitigate transmission in our community. This patient is at least at moderate risk for complications without adequate follow up. This format is felt to be most appropriate for this patient at this time. Physical exam was limited by quality of the video and audio technology used for the visit.   Patient location: home Provider location: bfp  I discussed the limitations of evaluation and management by telemedicine and the availability of in person appointments. The patient expressed understanding and agreed to proceed.  Patient: Cassandra Lowe   DOB: Oct 13, 1990   31 y.o. Female  MRN: [id number] Visit Date: 09/13/2021  Today's healthcare provider: Mila Merry, MD   No chief complaint on file.  Subjective    HPI  Discuss ordering a sling to help her caregiver get her in and out of bed. Would like to get a ceiling lift and wheelchair carrier her car.   Also discuss getting a medical mattress.   Also complains of mid and low back radiating into hips.   Medications: Outpatient Medications Prior to Visit  Medication Sig   albuterol (PROVENTIL HFA;VENTOLIN HFA) 108 (90 Base) MCG/ACT inhaler Inhale 2 puffs into the lungs every 6 (six) hours as needed for wheezing or shortness of breath. (Patient not taking: Reported on 06/13/2021)   Ascorbic Acid (VITAMIN C) 100 MG tablet Take 100 mg by mouth daily. When remembers   bisacodyl (MAGIC BULLETS) 10 MG suppository Place 1 suppository (10 mg total) rectally daily as needed for mild constipation or moderate constipation. (Patient not taking: Reported on 06/13/2021)   cyclobenzaprine (FLEXERIL) 10 MG tablet Take 10 mg by mouth 3 (three) times daily as needed.    diclofenac (VOLTAREN) 75 MG EC tablet 1  tablet (75 mg total) 2 (two)  times daily as needed. (Patient not taking: Reported on 06/13/2021)   EPINEPHrine 0.3 mg/0.3 mL IJ SOAJ injection Inject 0.3 mg into the muscle as needed for anaphylaxis.   Fluocinolone Acetonide Scalp 0.01 % OIL Apply topically daily. (Patient not taking: Reported on 06/13/2021)   fosfomycin (MONUROL) 3 g PACK SMARTSIG:1 Packet(s) By Mouth Every Other Day (Patient not taking: Reported on 06/13/2021)   Ginger, Zingiber officinalis, (GINGER PO) Take by mouth as needed.   ibuprofen (ADVIL) 600 MG tablet    ketoconazole (NIZORAL) 2 % shampoo Apply 1 application topically 2 (two) times a week.   midodrine (PROAMATINE) 5 MG tablet Take 2 tablets (10 mg total) by mouth 3 (three) times daily as needed.   Multiple Vitamin (MULTI-VITAMIN) tablet Take 1 tablet by mouth daily.   MYRBETRIQ 25 MG TB24 tablet Take by mouth. (Patient not taking: Reported on 06/13/2021)   norethindrone (MICRONOR) 0.35 MG tablet Take 1 tablet by mouth daily. (Patient not taking: Reported on 06/13/2021)   Omega-3 1000 MG CAPS Take by mouth. (Patient not taking: Reported on 06/13/2021)   ondansetron (ZOFRAN) 4 MG tablet Take 1 tablet (4 mg total) by mouth every 8 (eight) hours as needed for nausea or vomiting.   oxybutynin (DITROPAN) 5 MG tablet Take by mouth. (Patient not taking: Reported on 06/13/2021)   Probiotic Product (MAGE) CPDR Take 1 capsule by mouth daily.   saccharomyces boulardii (FLORASTOR) 250 MG capsule Take by mouth.   sodium chloride 0.9 % injection Inject into  the vein.   sodium chloride irrigation 0.9 % irrigation Irrigate with as directed.   sulfamethoxazole-trimethoprim (BACTRIM DS) 800-160 MG tablet Take 1 tablet by mouth 2 (two) times daily with a meal. (Patient not taking: Reported on 06/13/2021)   traMADol (ULTRAM) 50 MG tablet Take by mouth.   trospium (SANCTURA) 20 MG tablet Take by mouth. (Patient not taking: Reported on 06/13/2021)   No facility-administered medications prior to visit.     Review of Systems     Objective    There were no vitals taken for this visit.     Physical Exam    General: Appearance:    Thin female in no acute distress. Sitting in wheelchair in no distress.   Eyes:    PERRL, conjunctiva/corneas clear, EOM's intact       Lungs:     Clear to auscultation bilaterally, respirations unlabored  Heart:    Normal heart rate. Normal rhythm. No murmurs, rubs, or gallops.    MS:   All extremities are intact.  Mild low back para lumbar tenderness. No gross deformities.   Neurologic:   Awake, alert, oriented x 3. No apparent focal neurological defect.         Assessment & Plan     1. Acute midline low back pain with bilateral sciatica  - DG Lumbar Spine Complete; Future  2. Pruritus of scalp  - ketoconazole (NIZORAL) 2 % shampoo; Apply 1 Application topically 2 (two) times a week.  Dispense: 120 mL; Refill: 1     I discussed the assessment and treatment plan with the patient. The patient was provided an opportunity to ask questions and all were answered. The patient agreed with the plan and demonstrated an understanding of the instructions.   The patient was advised to call back or seek an in-person evaluation if the symptoms worsen or if the condition fails to improve as anticipated.  I provided 12 minutes of non-face-to-face time during this encounter.  The entirety of the information documented in the History of Present Illness, Review of Systems and Physical Exam were personally obtained by me. Portions of this information were initially documented by the CMA and reviewed by me for thoroughness and accuracy.    Mila Merry, MD [facility] [phone] (phone) [phone] (fax)  [facility]

## 2021-09-13 NOTE — Telephone Encounter (Signed)
Dr Sherrie Mustache do you have paperwork to fill out for this patient to get transportation to her appointments     Copied from CRM [id number]. Topic: Transportation - Transportation >> Sep 12, 2021 12:11 PM Turkey B wrote: Reason for [id number] states in was faed in last week for transportation, but a response back wasn't received and she sneeds it for her bone density test in appt in [place] and urology appt in [place]. Please call back >> Sep 13, 2021 10:22 AM Marlow Baars wrote: Patient called back today to check on status or transportation paperwork. She says she needs it filled out so medicaid will pay for her transportation. Please assist patient further as soon as possible

## 2021-09-15 ENCOUNTER — Ambulatory Visit
Admission: RE | Admit: 2021-09-15 | Discharge: 2021-09-15 | Disposition: A | Discharge status: Home / Self Care | Payer: Medicare Other | Source: Ambulatory Visit | Attending: Physician Assistant | Admitting: Physician Assistant

## 2021-09-15 DIAGNOSIS — X58XXXS Exposure to other specified factors, sequela: Secondary | ICD-10-CM | POA: Diagnosis not present

## 2021-09-15 DIAGNOSIS — G8250 Quadriplegia, unspecified: Secondary | ICD-10-CM | POA: Diagnosis not present

## 2021-09-15 DIAGNOSIS — Z13820 Encounter for screening for osteoporosis: Secondary | ICD-10-CM | POA: Diagnosis not present

## 2021-09-15 DIAGNOSIS — S129XXS Fracture of neck, unspecified, sequela: Secondary | ICD-10-CM | POA: Diagnosis not present

## 2021-09-15 DIAGNOSIS — M818 Other osteoporosis without current pathological fracture: Secondary | ICD-10-CM | POA: Insufficient documentation

## 2021-09-15 DIAGNOSIS — M810 Age-related osteoporosis without current pathological fracture: Secondary | ICD-10-CM | POA: Diagnosis not present

## 2021-09-16 DIAGNOSIS — Z435 Encounter for attention to cystostomy: Secondary | ICD-10-CM | POA: Diagnosis not present

## 2021-09-16 DIAGNOSIS — N319 Neuromuscular dysfunction of bladder, unspecified: Secondary | ICD-10-CM | POA: Diagnosis not present

## 2021-09-19 ENCOUNTER — Telehealth: Payer: Self-pay

## 2021-09-19 NOTE — Telephone Encounter (Signed)
Patient advised of results and Dr. Theodis Aguas recommendations. Patient has a few questions.She wants to know:  1) Are there any major side effects to Actonel that she should be aware of?   2) How bad is here osteoporosis? Is it severe or mild? Patient says she expected some degree of osteoporosis since she is in a wheel chair, but she wants to know how bad it is?   3) She states the bone density test was initially ordered because physical therapy wanted one before she used a standing frame. Since the bone density results show osteoporosis, patient wants to know if she can she still use her standing frame with physical therapy?

## 2021-09-19 NOTE — Telephone Encounter (Signed)
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 Recommend start OTC vitamin D3 2,000 units once a week and prescription risedronate (Actonel) 35mg  one tablet once a week, #12, rf x 1 and follow up in 2 months.

## 2021-09-21 ENCOUNTER — Telehealth: Payer: Self-pay

## 2021-09-21 NOTE — Telephone Encounter (Signed)
Copied from CRM [id number]. Topic: Transportation - Transportation >> Sep 21, 2021  2:39 PM Marlow Baars wrote: Reason for CRM: Patient called in stating the forms were filled out wrong because it doesn't state why she needs to travel so far to see her specialist in [place] which is because of a spinal cord injury. Please assist patient further

## 2021-09-21 NOTE — Telephone Encounter (Signed)
Patient called back in checking on the status of her questions she left the provider. Please assist patient further

## 2021-09-23 NOTE — Telephone Encounter (Signed)
It can cause acid reflux, so be sure to strictly follow directions. It causes new bone formation, so some people have some aching in bones, like growing pains.  We usually have people take it for 5 years, then take a break for a year which works better for preventing fractures.  Recommend she advise PT about osteoporosis, I'm not sure what issues are standing frame

## 2021-09-26 NOTE — Telephone Encounter (Signed)
Contacted pt but she was busy at another appt and agreeable to call office back shortly. Okay for [facility] triage to advise.

## 2021-09-30 ENCOUNTER — Telehealth: Payer: Self-pay

## 2021-09-30 DIAGNOSIS — R339 Retention of urine, unspecified: Secondary | ICD-10-CM | POA: Diagnosis not present

## 2021-09-30 NOTE — Telephone Encounter (Signed)
I assume she is talking about Xray of lumbar spine ordered on 09-13-2021. Ok to send order to [facility].

## 2021-09-30 NOTE — Telephone Encounter (Signed)
Copied from CRM [id number]. Topic: General - Inquiry >> Sep 30, 2021  1:38 PM De Blanch wrote: Reason for CRM: Pt is requesting for x-ray imaging order to be sent to [facility] [place].  Pt stated they are wheelchair accessible and closer to her.   Fax- [phone]

## 2021-09-30 NOTE — Telephone Encounter (Signed)
It's very severe, although that's not unusual for people with paraplegia.  Is unknown if this could cause harm to fetus. Is not recommend while pregnancy or while trying to get pregnancy. If she is planning on getting pregnancy would wait till pregnancy is over to start edication.

## 2021-09-30 NOTE — Telephone Encounter (Signed)
Pt. Given message, verbalizes understanding. Pt. Asking about bone density results in regard to severity. Also wants to know how pregnancy would be affected, she wants to have another baby. Please advise. Also would like a copy of Bone density mailed to her.

## 2021-10-03 DIAGNOSIS — N319 Neuromuscular dysfunction of bladder, unspecified: Secondary | ICD-10-CM | POA: Diagnosis not present

## 2021-10-03 DIAGNOSIS — N3010 Interstitial cystitis (chronic) without hematuria: Secondary | ICD-10-CM | POA: Diagnosis not present

## 2021-10-03 DIAGNOSIS — Z882 Allergy status to sulfonamides status: Secondary | ICD-10-CM | POA: Diagnosis not present

## 2021-10-03 DIAGNOSIS — Z885 Allergy status to narcotic agent status: Secondary | ICD-10-CM | POA: Diagnosis not present

## 2021-10-03 DIAGNOSIS — Z881 Allergy status to other antibiotic agents status: Secondary | ICD-10-CM | POA: Diagnosis not present

## 2021-10-03 NOTE — Telephone Encounter (Signed)
Faxed order to [phone].

## 2021-10-03 NOTE — Telephone Encounter (Signed)
Tried calling patient. Left message to call back. OK for [facility] triage to advise patient.

## 2021-10-03 NOTE — Telephone Encounter (Signed)
Pt returned call and was given the message that the x ray order was faxed to [facility].   She is requesting the order be modified to include her cervical spine to her lumbar spine because she has equipment in her cervical area too. She is requesting a call back when this is done so she can call and make the x ray appt.

## 2021-10-03 NOTE — Telephone Encounter (Signed)
Pt returned call.    She is requesting the order include the cervical spine to lumbar because she has equipment in her cervical spine also.   I let her know the previous order was faxed to [facility] as she requested because it's closer for her and wheelchair accessible.   She is requesting a call back when this is done so she can call and make an appt. For the x rays.

## 2021-10-05 ENCOUNTER — Other Ambulatory Visit: Payer: Self-pay | Admitting: Physician Assistant

## 2021-10-05 NOTE — Telephone Encounter (Signed)
Requested medication (s) are due for refill today: yes  Requested medication (s) are on the active medication list: yes  Last refill:  09/01/21 #180  Future visit scheduled: no  Notes to clinic:  med. Not delegated to NT to RF/overdue lab work   Requested Prescriptions  Pending Prescriptions Disp Refills   midodrine (PROAMATINE) 5 MG tablet [Pharmacy Med Name: MIDODRINE HCL 5 MG TABLET] 180 tablet 0    Sig: Take 2 tablets (10 mg total) by mouth 3 (three) times daily as needed.     Not Delegated - Cardiovascular: Midodrine Failed - 10/05/2021  1:59 AM      Failed - This refill cannot be delegated      Failed - Cr in normal range and within 360 days    Creatinine  Date Value Ref Range Status  02/11/2014 0.83 0.60 - 1.30 mg/dL Final   Creatinine, Ser  Date Value Ref Range Status  07/24/2015 0.55 0.44 - 1.00 mg/dL Final         Failed - ALT in normal range and within 360 days    ALT  Date Value Ref Range Status  07/24/2015 16 14 - 54 U/L Final   SGPT (ALT)  Date Value Ref Range Status  02/11/2014 25 U/L Final    Comment:    14-63 NOTE: New Reference Range 08/26/13          Failed - AST in normal range and within 360 days    AST  Date Value Ref Range Status  07/24/2015 21 15 - 41 U/L Final   SGOT(AST)  Date Value Ref Range Status  02/11/2014 30 15 - 37 Unit/L Final         Passed - Last BP in normal range    BP Readings from Last 1 Encounters:  10/19/20 117/77         Passed - Valid encounter within last 12 months    Recent Outpatient Visits           3 weeks ago Acute midline low back pain with bilateral sciatica   [facility] Malva Limes, MD   11 months ago Adverse effect of vaccine, initial encounter   [facility] Malva Limes, MD   1 year ago Quadriplegia [facility])   [facility] Malva Limes, MD   1 year ago Pruritus of scalp   [facility] Malva Limes, MD   2 years ago  Diarrhea, unspecified type   [facility] [place], Jodell [place], [place]

## 2021-10-31 DIAGNOSIS — R339 Retention of urine, unspecified: Secondary | ICD-10-CM | POA: Diagnosis not present

## 2021-11-04 DIAGNOSIS — N319 Neuromuscular dysfunction of bladder, unspecified: Secondary | ICD-10-CM | POA: Diagnosis not present

## 2021-11-04 DIAGNOSIS — Z466 Encounter for fitting and adjustment of urinary device: Secondary | ICD-10-CM | POA: Diagnosis not present

## 2021-11-10 ENCOUNTER — Telehealth: Payer: Self-pay

## 2021-11-10 DIAGNOSIS — G8929 Other chronic pain: Secondary | ICD-10-CM

## 2021-11-10 NOTE — Telephone Encounter (Signed)
Copied from [place] [phone] [id number]. Topic: General - Other >> Nov 10, 2021  1:15 PM Leitha Schuller wrote: Pt checking status of full spinal xray request  Pt requesting a return call to discuss request  Please fu w/ pt

## 2021-11-16 NOTE — Addendum Note (Signed)
Addended by: Birdie Sons on: 11/16/2021 04:04 PM   Modules accepted: Orders

## 2021-11-16 NOTE — Telephone Encounter (Signed)
Ok to order cervical, lumbar and thoracic spine, but I don't know how to order it from [facility] which is she wanted to do it before. Marland Kitchen

## 2021-11-23 NOTE — Addendum Note (Signed)
Addended by: Birdie Sons on: 11/23/2021 08:02 AM   Modules accepted: Orders

## 2021-11-29 DIAGNOSIS — R339 Retention of urine, unspecified: Secondary | ICD-10-CM | POA: Diagnosis not present

## 2021-12-09 DIAGNOSIS — N319 Neuromuscular dysfunction of bladder, unspecified: Secondary | ICD-10-CM | POA: Diagnosis not present

## 2021-12-13 DIAGNOSIS — N644 Mastodynia: Secondary | ICD-10-CM | POA: Diagnosis not present

## 2021-12-13 DIAGNOSIS — R110 Nausea: Secondary | ICD-10-CM | POA: Diagnosis not present

## 2021-12-20 DIAGNOSIS — Z1159 Encounter for screening for other viral diseases: Secondary | ICD-10-CM | POA: Diagnosis not present

## 2021-12-20 DIAGNOSIS — Z118 Encounter for screening for other infectious and parasitic diseases: Secondary | ICD-10-CM | POA: Diagnosis not present

## 2021-12-20 DIAGNOSIS — G8250 Quadriplegia, unspecified: Secondary | ICD-10-CM | POA: Diagnosis not present

## 2021-12-20 DIAGNOSIS — Z28310 Unvaccinated for covid-19: Secondary | ICD-10-CM | POA: Diagnosis not present

## 2021-12-20 DIAGNOSIS — Z2821 Immunization not carried out because of patient refusal: Secondary | ICD-10-CM | POA: Diagnosis not present

## 2021-12-20 DIAGNOSIS — N319 Neuromuscular dysfunction of bladder, unspecified: Secondary | ICD-10-CM | POA: Diagnosis not present

## 2021-12-31 DIAGNOSIS — R339 Retention of urine, unspecified: Secondary | ICD-10-CM | POA: Diagnosis not present

## 2022-01-06 DIAGNOSIS — N319 Neuromuscular dysfunction of bladder, unspecified: Secondary | ICD-10-CM | POA: Diagnosis not present

## 2022-01-06 DIAGNOSIS — Z9359 Other cystostomy status: Secondary | ICD-10-CM | POA: Diagnosis not present

## 2022-01-09 ENCOUNTER — Telehealth: Payer: Medicare Other | Admitting: Family Medicine

## 2022-01-09 ENCOUNTER — Telehealth (INDEPENDENT_AMBULATORY_CARE_PROVIDER_SITE_OTHER): Payer: Medicare Other | Admitting: Family Medicine

## 2022-01-09 DIAGNOSIS — R051 Acute cough: Secondary | ICD-10-CM

## 2022-01-09 DIAGNOSIS — J069 Acute upper respiratory infection, unspecified: Secondary | ICD-10-CM | POA: Diagnosis not present

## 2022-01-09 MED ORDER — HYDROCODONE BIT-HOMATROP MBR 5-1.5 MG/5ML PO SOLN: 5.0000 mL | Freq: Three times a day (TID) | ORAL | 0 refills | Status: DC | PRN

## 2022-01-09 NOTE — Progress Notes (Signed)
MyChart Video Visit    Virtual Visit via Video Note   This format is felt to be most appropriate for this patient at this time. Physical exam was limited by quality of the video and audio technology used for the visit.   Patient location: home Provider location: [facility]  I discussed the limitations of evaluation and management by telemedicine and the availability of in person appointments. The patient expressed understanding and agreed to proceed.  Patient: Cassandra Lowe   DOB: Jan 19, 1991   31 y.o. Female  MRN: [id number] Visit Date: 01/09/2022  Today's healthcare provider: Mila Merry, MD   No chief complaint on file.  Subjective    HPI  Patient has had a non-productive cough for around one week. Patient also has a sore throat. No fever. Patient has been treating symptoms with otc medications with no relief. States cough is keeping her up at night. No longer having any fevers, chills, or sweats. Cough is non-productive.   Medications: Outpatient Medications Prior to Visit  Medication Sig   albuterol (PROVENTIL HFA;VENTOLIN HFA) 108 (90 Base) MCG/ACT inhaler Inhale 2 puffs into the lungs every 6 (six) hours as needed for wheezing or shortness of breath. (Patient not taking: Reported on 06/13/2021)   Ascorbic Acid (VITAMIN C) 100 MG tablet Take 100 mg by mouth daily. When remembers   bisacodyl (MAGIC BULLETS) 10 MG suppository Place 1 suppository (10 mg total) rectally daily as needed for mild constipation or moderate constipation. (Patient not taking: Reported on 06/13/2021)   cyclobenzaprine (FLEXERIL) 10 MG tablet Take 10 mg by mouth 3 (three) times daily as needed.    diclofenac (VOLTAREN) 75 MG EC tablet 1 tablet (75 mg total) 2 (two)  times daily as needed. (Patient not taking: Reported on 06/13/2021)   EPINEPHrine 0.3 mg/0.3 mL IJ SOAJ injection Inject 0.3 mg into the muscle as needed for anaphylaxis.   Fluocinolone Acetonide Scalp 0.01 % OIL Apply topically daily. (Patient  not taking: Reported on 06/13/2021)   fosfomycin (MONUROL) 3 g PACK SMARTSIG:1 Packet(s) By Mouth Every Other Day (Patient not taking: Reported on 06/13/2021)   Ginger, Zingiber officinalis, (GINGER PO) Take by mouth as needed.   ibuprofen (ADVIL) 600 MG tablet    ketoconazole (NIZORAL) 2 % shampoo Apply 1 Application topically 2 (two) times a week.   midodrine (PROAMATINE) 5 MG tablet TAKE 2 TABLETS (10 MG TOTAL) BY MOUTH 3 (THREE) TIMES DAILY AS NEEDED.   Multiple Vitamin (MULTI-VITAMIN) tablet Take 1 tablet by mouth daily.   MYRBETRIQ 25 MG TB24 tablet Take by mouth. (Patient not taking: Reported on 06/13/2021)   norethindrone (MICRONOR) 0.35 MG tablet Take 1 tablet by mouth daily. (Patient not taking: Reported on 06/13/2021)   Omega-3 1000 MG CAPS Take by mouth. (Patient not taking: Reported on 06/13/2021)   ondansetron (ZOFRAN) 4 MG tablet Take 1 tablet (4 mg total) by mouth every 8 (eight) hours as needed for nausea or vomiting.   oxybutynin (DITROPAN) 5 MG tablet Take by mouth. (Patient not taking: Reported on 06/13/2021)   Probiotic Product (MAGE) CPDR Take 1 capsule by mouth daily.   saccharomyces boulardii (FLORASTOR) 250 MG capsule Take by mouth. (Patient not taking: Reported on 09/13/2021)   sodium chloride 0.9 % injection Inject into the vein.   sodium chloride irrigation 0.9 % irrigation Irrigate with as directed.   traMADol (ULTRAM) 50 MG tablet Take by mouth.   No facility-administered medications prior to visit.    Review of Systems  Constitutional:  Negative for appetite change, chills, fatigue and fever.  Respiratory:  Negative for chest tightness and shortness of breath.   Cardiovascular:  Negative for chest pain and palpitations.  Gastrointestinal:  Negative for abdominal pain, nausea and vomiting.  Neurological:  Negative for dizziness and weakness.       Objective    There were no vitals taken for this visit.     Physical Exam  Awake, alert, oriented x 3. In no  apparent distress    Assessment & Plan     1. Upper respiratory tract infection, unspecified type   2. Acute cough  - HYDROcodone bit-homatropine (HYCODAN) 5-1.5 MG/5ML syrup; Take 5 mLs by mouth every 8 (eight) hours as needed for cough.  Dispense: 120 mL; Refill: 0   Call if symptoms change or if not rapidly improving.       I discussed the assessment and treatment plan with the patient. The patient was provided an opportunity to ask questions and all were answered. The patient agreed with the plan and demonstrated an understanding of the instructions.   The patient was advised to call back or seek an in-person evaluation if the symptoms worsen or if the condition fails to improve as anticipated.  I provided 9 minutes of non-face-to-face time during this encounter.  The entirety of the information documented in the History of Present Illness, Review of Systems and Physical Exam were personally obtained by me. Portions of this information were initially documented by the CMA and reviewed by me for thoroughness and accuracy.    Mila Merry, MD [facility] [phone] (phone) [phone] (fax)  [facility]

## 2022-01-13 DIAGNOSIS — N390 Urinary tract infection, site not specified: Secondary | ICD-10-CM | POA: Diagnosis not present

## 2022-01-27 DIAGNOSIS — G8250 Quadriplegia, unspecified: Secondary | ICD-10-CM | POA: Diagnosis not present

## 2022-01-27 DIAGNOSIS — Q8711 Prader-Willi syndrome: Secondary | ICD-10-CM | POA: Diagnosis not present

## 2022-01-27 DIAGNOSIS — Q9388 Other microdeletions: Secondary | ICD-10-CM | POA: Diagnosis not present

## 2022-01-27 DIAGNOSIS — N319 Neuromuscular dysfunction of bladder, unspecified: Secondary | ICD-10-CM | POA: Diagnosis not present

## 2022-01-27 DIAGNOSIS — Z7982 Long term (current) use of aspirin: Secondary | ICD-10-CM | POA: Diagnosis not present

## 2022-01-31 DIAGNOSIS — R339 Retention of urine, unspecified: Secondary | ICD-10-CM | POA: Diagnosis not present

## 2022-02-02 DIAGNOSIS — G904 Autonomic dysreflexia: Secondary | ICD-10-CM | POA: Diagnosis not present

## 2022-02-02 DIAGNOSIS — Z87440 Personal history of urinary (tract) infections: Secondary | ICD-10-CM | POA: Diagnosis not present

## 2022-02-02 DIAGNOSIS — G8250 Quadriplegia, unspecified: Secondary | ICD-10-CM | POA: Diagnosis not present

## 2022-02-02 DIAGNOSIS — N319 Neuromuscular dysfunction of bladder, unspecified: Secondary | ICD-10-CM | POA: Diagnosis not present

## 2022-02-02 DIAGNOSIS — N390 Urinary tract infection, site not specified: Secondary | ICD-10-CM | POA: Diagnosis not present

## 2022-02-03 DIAGNOSIS — N319 Neuromuscular dysfunction of bladder, unspecified: Secondary | ICD-10-CM | POA: Diagnosis not present

## 2022-02-03 DIAGNOSIS — N390 Urinary tract infection, site not specified: Secondary | ICD-10-CM | POA: Diagnosis not present

## 2022-02-03 DIAGNOSIS — Z87440 Personal history of urinary (tract) infections: Secondary | ICD-10-CM | POA: Diagnosis not present

## 2022-02-03 DIAGNOSIS — G904 Autonomic dysreflexia: Secondary | ICD-10-CM | POA: Diagnosis not present

## 2022-02-03 DIAGNOSIS — G8250 Quadriplegia, unspecified: Secondary | ICD-10-CM | POA: Diagnosis not present

## 2022-02-09 DIAGNOSIS — N319 Neuromuscular dysfunction of bladder, unspecified: Secondary | ICD-10-CM | POA: Diagnosis not present

## 2022-02-10 ENCOUNTER — Telehealth: Payer: Self-pay | Admitting: Family Medicine

## 2022-02-10 NOTE — Telephone Encounter (Signed)
Copied from [place] [id number]. Topic: General - Other >> Feb 10, 2022 12:15 PM Teressa P wrote: Pt called asking about an order for a spine xray.  I seen two in there from oct 23.  I am not sure if these expired or not.  She said she needs an order for the complete spine.  Please advise  [phone]

## 2022-02-13 ENCOUNTER — Encounter: Payer: Self-pay | Admitting: Family Medicine

## 2022-02-13 NOTE — Telephone Encounter (Signed)
Those orders are still good.

## 2022-02-13 NOTE — Telephone Encounter (Signed)
Patient advised. Verbalized understanding 

## 2022-02-15 NOTE — Telephone Encounter (Signed)
That's fine, can send order for c spine and thoracic spine to [facility] as requested

## 2022-02-17 NOTE — Telephone Encounter (Signed)
Copied from [place] [phone]. Topic: General - Other >> Feb 17, 2022 11:13 AM Everette C wrote: Reason for CRM: The patient would like to be contacted by a member of clinical staff if/when possible to confirm that their previous request for spinal imaging orders has been submitted to  [facility].  [address] [place],  [address] phone [phone] Fax [phone]  Please contact further

## 2022-02-17 NOTE — Telephone Encounter (Signed)
The patient has made contact to follow up on their request for imaging orders  Please contact further if/when available

## 2022-02-20 ENCOUNTER — Ambulatory Visit (INDEPENDENT_AMBULATORY_CARE_PROVIDER_SITE_OTHER): Payer: 59 | Admitting: Family Medicine

## 2022-02-20 ENCOUNTER — Encounter: Payer: Self-pay | Admitting: Family Medicine

## 2022-02-20 VITALS — BP 100/55 | HR 71 | Ht 64.5 in | Wt 100.0 lb

## 2022-02-20 DIAGNOSIS — M532X9 Spinal instabilities, site unspecified: Secondary | ICD-10-CM

## 2022-02-20 DIAGNOSIS — G8250 Quadriplegia, unspecified: Secondary | ICD-10-CM | POA: Diagnosis not present

## 2022-02-20 DIAGNOSIS — S129XXS Fracture of neck, unspecified, sequela: Secondary | ICD-10-CM

## 2022-02-20 DIAGNOSIS — S14105S Unspecified injury at C5 level of cervical spinal cord, sequela: Secondary | ICD-10-CM

## 2022-02-20 DIAGNOSIS — R29898 Other symptoms and signs involving the musculoskeletal system: Secondary | ICD-10-CM | POA: Diagnosis not present

## 2022-02-20 DIAGNOSIS — Z7409 Other reduced mobility: Secondary | ICD-10-CM

## 2022-02-20 NOTE — Progress Notes (Signed)
I,Sha'taria Tyson,acting as a Education administrator for Lelon Huh, MD.,have documented all relevant documentation on the behalf of Lelon Huh, MD,as directed by  Lelon Huh, MD while in the presence of Lelon Huh, MD.   Established patient visit   Patient: Cassandra Lowe   DOB: Oct 01, 1990   31 y.o. Female  MRN: [id number] Visit Date: 02/20/2022  Today's healthcare provider: Lelon Huh, MD   No chief complaint on file.  Subjective    HPI   Patient presents for mobility evaluation. She is quadriplegic as a result of cervical burst fracture and spinal cord injury in 2016 and has been wheelchair ever since. She has been using a manual wheelchair with power assist for the last few years but this does not provide the support she needs for her back. She has poor upper extremity strength but is able to grasp and accurately control wheelchair joystick. She requires maximum assistance to transfer from wheelchair to toilet and shower and is also in need of specialized toilet safety rail and specialized shower chair.    Medications: Outpatient Medications Prior to Visit  Medication Sig   albuterol (PROVENTIL HFA;VENTOLIN HFA) 108 (90 Base) MCG/ACT inhaler Inhale 2 puffs into the lungs every 6 (six) hours as needed for wheezing or shortness of breath. (Patient not taking: Reported on 06/13/2021)   Ascorbic Acid (VITAMIN C) 100 MG tablet Take 100 mg by mouth daily. When remembers (Patient not taking: Reported on 01/09/2022)   bisacodyl (MAGIC BULLETS) 10 MG suppository Place 1 suppository (10 mg total) rectally daily as needed for mild constipation or moderate constipation. (Patient not taking: Reported on 06/13/2021)   cyclobenzaprine (FLEXERIL) 10 MG tablet Take 10 mg by mouth 3 (three) times daily as needed.    diclofenac (VOLTAREN) 75 MG EC tablet 1 tablet (75 mg total) 2 (two)  times daily as needed. (Patient not taking: Reported on 06/13/2021)   EPINEPHrine 0.3 mg/0.3 mL IJ SOAJ injection  Inject 0.3 mg into the muscle as needed for anaphylaxis. (Patient not taking: Reported on 01/09/2022)   Fluocinolone Acetonide Scalp 0.01 % OIL Apply topically daily. (Patient not taking: Reported on 06/13/2021)   fosfomycin (MONUROL) 3 g PACK SMARTSIG:1 Packet(s) By Mouth Every Other Day (Patient not taking: Reported on 06/13/2021)   Ginger, Zingiber officinalis, (GINGER PO) Take by mouth as needed. (Patient not taking: Reported on 01/09/2022)   HYDROcodone bit-homatropine (HYCODAN) 5-1.5 MG/5ML syrup Take 5 mLs by mouth every 8 (eight) hours as needed for cough.   ibuprofen (ADVIL) 600 MG tablet  (Patient not taking: Reported on 01/09/2022)   ketoconazole (NIZORAL) 2 % shampoo Apply 1 Application topically 2 (two) times a week. (Patient not taking: Reported on 01/09/2022)   midodrine (PROAMATINE) 5 MG tablet TAKE 2 TABLETS (10 MG TOTAL) BY MOUTH 3 (THREE) TIMES DAILY AS NEEDED.   Multiple Vitamin (MULTI-VITAMIN) tablet Take 1 tablet by mouth daily. (Patient not taking: Reported on 01/09/2022)   MYRBETRIQ 25 MG TB24 tablet Take by mouth. (Patient not taking: Reported on 06/13/2021)   norethindrone (MICRONOR) 0.35 MG tablet Take 1 tablet by mouth daily. (Patient not taking: Reported on 06/13/2021)   Omega-3 1000 MG CAPS Take by mouth. (Patient not taking: Reported on 06/13/2021)   ondansetron (ZOFRAN) 4 MG tablet Take 1 tablet (4 mg total) by mouth every 8 (eight) hours as needed for nausea or vomiting. (Patient not taking: Reported on 01/09/2022)   oxybutynin (DITROPAN) 5 MG tablet Take by mouth. (Patient not taking: Reported on 06/13/2021)  Probiotic Product (MAGE) CPDR Take 1 capsule by mouth daily. (Patient not taking: Reported on 01/09/2022)   saccharomyces boulardii (FLORASTOR) 250 MG capsule Take by mouth. (Patient not taking: Reported on 09/13/2021)   sodium chloride 0.9 % injection Inject into the vein. (Patient not taking: Reported on 01/09/2022)   sodium chloride irrigation 0.9 % irrigation Irrigate with as  directed. (Patient not taking: Reported on 01/09/2022)   traMADol (ULTRAM) 50 MG tablet Take by mouth. (Patient not taking: Reported on 01/09/2022)   No facility-administered medications prior to visit.       Objective    BP (!) 100/55 (BP Location: Right Arm, Patient Position: Sitting, Cuff Size: Large)   Pulse 71   Ht 5' 4.5" (1.638 m)   Wt 100 lb (45.4 kg)   SpO2 99%   BMI 16.90 kg/m    Physical Exam   General appearance: Thin female, cooperative and in no acute distress. Sitting in manual chair in no distress.  Head: Normocephalic, without obvious abnormality, atraumatic Respiratory: Respirations even and unlabored, normal respiratory rate Extremities: All extremities are intact.  Skin: Skin color, texture, turgor normal. No rashes seen  Psych: Appropriate mood and affect. Neurologic: Mental status: Alert, oriented to person, place, and time, thought content appropriate. +1 LE strength, +3 UE strength. She requires maximum assistance to get out of chair and stand. Weak grip strength, but able grasp and operate wheelchair controls.   Assessment & Plan     1. Mobility impaired  2. Quadriplegia ([place])  3. C5-C7 level spinal cord injury, sequela (HCC)  4. Closed burst fracture of cervical vertebra, sequela  5. Weakness of both upper extremities  6. Weakness of both lower extremities  7. Acquired spinal instability   Her current power assisted manual wheelchair is no longer able to supply back support she requires and is need of evaluation for motorized wheelchair. She requires power wheelchair for ADLs including getting from bed to bathroom, showering cleaning and eating. She clearly has mental and physical capabilities to operate a power wheelchair.  Refer to physical for PT mobility and seating evaluation for power wheelchair. Is also in need of specialized shower chair and specialized toilet safety rail.        The entirety of the information documented in the History  of Present Illness, Review of Systems and Physical Exam were personally obtained by me. Portions of this information were initially documented by the CMA and reviewed by me for thoroughness and accuracy.     Lelon Huh, MD  [facility] [phone] (phone) [phone] (fax)  [place]

## 2022-02-21 DIAGNOSIS — Z9189 Other specified personal risk factors, not elsewhere classified: Secondary | ICD-10-CM | POA: Diagnosis not present

## 2022-02-22 ENCOUNTER — Telehealth: Payer: Self-pay

## 2022-02-22 DIAGNOSIS — M542 Cervicalgia: Secondary | ICD-10-CM

## 2022-02-22 DIAGNOSIS — M5441 Lumbago with sciatica, right side: Secondary | ICD-10-CM

## 2022-02-23 NOTE — Telephone Encounter (Signed)
That's fine, but I don't how to fax an order there.

## 2022-02-23 NOTE — Telephone Encounter (Signed)
Orders printed and faxed.

## 2022-03-02 DIAGNOSIS — R339 Retention of urine, unspecified: Secondary | ICD-10-CM | POA: Diagnosis not present

## 2022-03-10 DIAGNOSIS — N319 Neuromuscular dysfunction of bladder, unspecified: Secondary | ICD-10-CM | POA: Diagnosis not present

## 2022-03-10 DIAGNOSIS — Z466 Encounter for fitting and adjustment of urinary device: Secondary | ICD-10-CM | POA: Diagnosis not present

## 2022-03-28 DIAGNOSIS — N319 Neuromuscular dysfunction of bladder, unspecified: Secondary | ICD-10-CM | POA: Diagnosis not present

## 2022-04-01 DIAGNOSIS — R339 Retention of urine, unspecified: Secondary | ICD-10-CM | POA: Diagnosis not present

## 2022-04-04 DIAGNOSIS — G8250 Quadriplegia, unspecified: Secondary | ICD-10-CM | POA: Diagnosis not present

## 2022-04-04 DIAGNOSIS — N390 Urinary tract infection, site not specified: Secondary | ICD-10-CM | POA: Diagnosis not present

## 2022-04-05 ENCOUNTER — Telehealth: Payer: Self-pay

## 2022-04-05 NOTE — Telephone Encounter (Signed)
Patient called and says DSS sent over a 50/58 Transportation form that Dr. Caryn Section needs to fill out specific as to why she has to go to [facility] for S/P catheter change due to her spinal cord injury condition and that urologist specializes in spinal cord injury conditions. She says the form was faxed today and that if he could send a MyChart message when it's faxed back so that she can follow up with transportation, upcoming appointment is on 04/14/22. Advised I will send this to Dr. Caryn Section.

## 2022-04-20 DIAGNOSIS — N390 Urinary tract infection, site not specified: Secondary | ICD-10-CM | POA: Diagnosis not present

## 2022-04-20 DIAGNOSIS — N319 Neuromuscular dysfunction of bladder, unspecified: Secondary | ICD-10-CM | POA: Diagnosis not present

## 2022-05-02 ENCOUNTER — Telehealth: Payer: Self-pay | Admitting: Family Medicine

## 2022-05-02 NOTE — Telephone Encounter (Signed)
[place] to schedule their annual wellness visit. Appointment made for 06/26/2022.  [place] Direct Dial: [phone]

## 2022-05-10 DIAGNOSIS — R8290 Unspecified abnormal findings in urine: Secondary | ICD-10-CM | POA: Diagnosis not present

## 2022-05-10 DIAGNOSIS — N319 Neuromuscular dysfunction of bladder, unspecified: Secondary | ICD-10-CM | POA: Diagnosis not present

## 2022-05-10 DIAGNOSIS — R944 Abnormal results of kidney function studies: Secondary | ICD-10-CM | POA: Diagnosis not present

## 2022-05-10 DIAGNOSIS — R82998 Other abnormal findings in urine: Secondary | ICD-10-CM | POA: Diagnosis not present

## 2022-05-10 DIAGNOSIS — N390 Urinary tract infection, site not specified: Secondary | ICD-10-CM | POA: Diagnosis not present

## 2022-06-08 DIAGNOSIS — N319 Neuromuscular dysfunction of bladder, unspecified: Secondary | ICD-10-CM | POA: Diagnosis not present

## 2022-06-08 DIAGNOSIS — R93421 Abnormal radiologic findings on diagnostic imaging of right kidney: Secondary | ICD-10-CM | POA: Diagnosis not present

## 2022-06-08 DIAGNOSIS — N390 Urinary tract infection, site not specified: Secondary | ICD-10-CM | POA: Diagnosis not present

## 2022-06-19 ENCOUNTER — Telehealth: Payer: Self-pay | Admitting: Family Medicine

## 2022-06-19 NOTE — Telephone Encounter (Signed)
Zenia Resides calling from [employer] is calling to follow up on an order sent Jun 13, 2022.CB- [phone].For order for a wheelchair order by a Physical Therapist. Fax- [phone] Advised it was faxed back on 06/14/22  Eunice Blase states that it was not received.

## 2022-06-21 DIAGNOSIS — M546 Pain in thoracic spine: Secondary | ICD-10-CM | POA: Diagnosis not present

## 2022-06-21 DIAGNOSIS — R93429 Abnormal radiologic findings on diagnostic imaging of unspecified kidney: Secondary | ICD-10-CM | POA: Diagnosis not present

## 2022-06-21 DIAGNOSIS — M5137 Other intervertebral disc degeneration, lumbosacral region: Secondary | ICD-10-CM | POA: Diagnosis not present

## 2022-06-21 DIAGNOSIS — M50323 Other cervical disc degeneration at C6-C7 level: Secondary | ICD-10-CM | POA: Diagnosis not present

## 2022-06-21 DIAGNOSIS — M47812 Spondylosis without myelopathy or radiculopathy, cervical region: Secondary | ICD-10-CM | POA: Diagnosis not present

## 2022-06-21 DIAGNOSIS — M4312 Spondylolisthesis, cervical region: Secondary | ICD-10-CM | POA: Diagnosis not present

## 2022-06-21 DIAGNOSIS — M47817 Spondylosis without myelopathy or radiculopathy, lumbosacral region: Secondary | ICD-10-CM | POA: Diagnosis not present

## 2022-06-22 DIAGNOSIS — R339 Retention of urine, unspecified: Secondary | ICD-10-CM | POA: Diagnosis not present

## 2022-06-26 ENCOUNTER — Telehealth: Payer: Self-pay | Admitting: *Deleted

## 2022-06-26 ENCOUNTER — Ambulatory Visit (INDEPENDENT_AMBULATORY_CARE_PROVIDER_SITE_OTHER): Payer: 59

## 2022-06-26 VITALS — Ht 65.0 in | Wt 100.0 lb

## 2022-06-26 DIAGNOSIS — G8250 Quadriplegia, unspecified: Secondary | ICD-10-CM | POA: Diagnosis not present

## 2022-06-26 DIAGNOSIS — R29898 Other symptoms and signs involving the musculoskeletal system: Secondary | ICD-10-CM | POA: Diagnosis not present

## 2022-06-26 DIAGNOSIS — M532X9 Spinal instabilities, site unspecified: Secondary | ICD-10-CM | POA: Diagnosis not present

## 2022-06-26 DIAGNOSIS — Z0000 Encounter for general adult medical examination without abnormal findings: Secondary | ICD-10-CM

## 2022-06-26 DIAGNOSIS — S14105D Unspecified injury at C5 level of cervical spinal cord, subsequent encounter: Secondary | ICD-10-CM | POA: Diagnosis not present

## 2022-06-26 DIAGNOSIS — H547 Unspecified visual loss: Secondary | ICD-10-CM

## 2022-06-26 DIAGNOSIS — S129XXD Fracture of neck, unspecified, subsequent encounter: Secondary | ICD-10-CM | POA: Diagnosis not present

## 2022-06-26 DIAGNOSIS — Z7409 Other reduced mobility: Secondary | ICD-10-CM | POA: Diagnosis not present

## 2022-06-26 NOTE — Patient Instructions (Signed)
Cassandra Lowe , Thank you for taking time to come for your Medicare Wellness Visit. I appreciate your ongoing commitment to your health goals. Please review the following plan we discussed and let me know if I can assist you in the future.   These are the goals we discussed:  Goals      DIET - EAT MORE FRUITS AND VEGETABLES     DIET - INCREASE WATER INTAKE     Recommend increasing water intake to 4-6 glasses a day.         This is a list of the screening recommended for you and due dates:  Health Maintenance  Topic Date Due   COVID-19 Vaccine (1) Never done   HIV Screening  Never done   Pap Smear  06/09/2019   DTaP/Tdap/Td vaccine (2 - Td or Tdap) 10/21/2019   Flu Shot  09/07/2022   Medicare Annual Wellness Visit  06/26/2023   HPV Vaccine  Completed   Hepatitis C Screening: USPSTF Recommendation to screen - Ages 18-79 yo.  Completed    Advanced directives: no  Conditions/risks identified: low falls risk  Next appointment: Follow up in one year for your annual wellness visit. 06/27/2023 @2 :30pm telephone  Preventive Care 20-64 Years Old, Female Preventive care refers to lifestyle choices and visits with your health care provider that can promote health and wellness. Preventive care visits are also called wellness exams. What can I expect for my preventive care visit? Counseling During your preventive care visit, your health care provider may ask about your: Medical history, including: Past medical problems. Family medical history. Pregnancy history. Current health, including: Menstrual cycle. Method of birth control. Emotional well-being. Home life and relationship well-being. Sexual activity and sexual health. Lifestyle, including: Alcohol, nicotine or tobacco, and drug use. Access to firearms. Diet, exercise, and sleep habits. Work and work Astronomer. Sunscreen use. Safety issues such as seatbelt and bike helmet use. Physical exam Your health care provider may  check your: Height and weight. These may be used to calculate your BMI (body mass index). BMI is a measurement that tells if you are at a healthy weight. Waist circumference. This measures the distance around your waistline. This measurement also tells if you are at a healthy weight and may help predict your risk of certain diseases, such as type 2 diabetes and high blood pressure. Heart rate and blood pressure. Body temperature. Skin for abnormal spots. What immunizations do I need? Vaccines are usually given at various ages, according to a schedule. Your health care provider will recommend vaccines for you based on your age, medical history, and lifestyle or other factors, such as travel or where you work. What tests do I need? Screening Your health care provider may recommend screening tests for certain conditions. This may include: Pelvic exam and Pap test. Lipid and cholesterol levels. Diabetes screening. This is done by checking your blood sugar (glucose) after you have not eaten for a while (fasting). Hepatitis B test. Hepatitis C test. HIV (human immunodeficiency virus) test. STI (sexually transmitted infection) testing, if you are at risk. BRCA-related cancer screening. This may be done if you have a family history of breast, ovarian, tubal, or peritoneal cancers. Talk with your health care provider about your test results, treatment options, and if necessary, the need for more tests. Follow these instructions at home: Eating and drinking  Eat a healthy diet that includes fresh fruits and vegetables, whole grains, lean protein, and low-fat dairy products. Take vitamin and mineral supplements  as recommended by your health care provider. Do not drink alcohol if: Your health care provider tells you not to drink. You are pregnant, may be pregnant, or are planning to become pregnant. If you drink alcohol: Limit how much you have to 0-1 drink a day. Know how much alcohol is in your  drink. In the [place] one drink equals one 12 oz bottle of beer (355 mL), one 5 oz glass of wine (148 mL), or one 1 oz glass of hard liquor (44 mL). Lifestyle Brush your teeth every morning and night with fluoride toothpaste. Floss one time each day. Exercise for at least 30 minutes 5 or more days each week. Do not use any products that contain nicotine or tobacco. These products include cigarettes, chewing tobacco, and vaping devices, such as e-cigarettes. If you need help quitting, ask your health care provider. Do not use drugs. If you are sexually active, practice safe sex. Use a condom or other form of protection to prevent STIs. If you do not wish to become pregnant, use a form of birth control. If you plan to become pregnant, see your health care provider for a prepregnancy visit. Find healthy ways to manage stress, such as: Meditation, yoga, or listening to music. Journaling. Talking to a trusted person. Spending time with friends and family. Minimize exposure to UV radiation to reduce your risk of skin cancer. Safety Always wear your seat belt while driving or riding in a vehicle. Do not drive: If you have been drinking alcohol. Do not ride with someone who has been drinking. If you have been using any mind-altering substances or drugs. While texting. When you are tired or distracted. Wear a helmet and other protective equipment during sports activities. If you have firearms in your house, make sure you follow all gun safety procedures. Seek help if you have been physically or sexually abused. What's next? Go to your health care provider once a year for an annual wellness visit. Ask your health care provider how often you should have your eyes and teeth checked. Stay up to date on all vaccines. This information is not intended to replace advice given to you by your health care provider. Make sure you discuss any questions you have with your health care provider. Document Revised:  07/21/2020 Document Reviewed: 07/21/2020 Elsevier Patient Education  2022 [employer].

## 2022-06-26 NOTE — Telephone Encounter (Signed)
Please resend requested order

## 2022-06-26 NOTE — Progress Notes (Unsigned)
  Care Coordination  Outreach Note  06/26/2022 Name: Cassandra Lowe MRN: [id number] DOB: 06/27/1990   Care Coordination Outreach Attempts: An unsuccessful telephone outreach was attempted today to offer the patient information about available care coordination services.  Follow Up Plan:  Additional outreach attempts will be made to offer the patient care coordination information and services.   Encounter Outcome:  No Answer  Burman Nieves, CCMA Care Coordination Care Guide Direct Dial: [phone]

## 2022-06-26 NOTE — Progress Notes (Signed)
Subjective:   Cassandra Lowe is a 32 y.o. female who presents for Medicare Annual (Subsequent) preventive examination.  Review of Systems    Cardiac Risk Factors include: sedentary lifestyle    Objective:    Today's Vitals   06/26/22 1429 06/26/22 1430  Weight: 100 lb (45.4 kg)   Height: 5\' 5"  (1.651 m)   PainSc:  5    Body mass index is 16.64 kg/m.     06/26/2022    2:46 PM 06/13/2021    1:08 PM 05/13/2019   10:27 AM 04/30/2017    5:32 PM 03/12/2017    2:04 PM 07/24/2015    4:04 PM  Advanced Directives  Does Patient Have a Medical Advance Directive? No No Yes No No No  Type of Surveyor, minerals;Living will     Copy of Healthcare Power of Attorney in Chart?   No - copy requested     Would patient like information on creating a medical advance directive?  No - Patient declined  Yes (MAU/Ambulatory/Procedural Areas - Information given) Yes (MAU/Ambulatory/Procedural Areas - Information given) No - patient declined information    Current Medications (verified) Outpatient Encounter Medications as of 06/26/2022  Medication Sig   albuterol (PROVENTIL HFA;VENTOLIN HFA) 108 (90 Base) MCG/ACT inhaler Inhale 2 puffs into the lungs every 6 (six) hours as needed for wheezing or shortness of breath.   Ascorbic Acid (VITAMIN C) 100 MG tablet Take 100 mg by mouth daily. When remembers   EPINEPHrine 0.3 mg/0.3 mL IJ SOAJ injection Inject 0.3 mg into the muscle as needed for anaphylaxis.   Fluocinolone Acetonide Scalp 0.01 % OIL Apply topically daily.   Ginger, Zingiber officinalis, (GINGER PO) Take by mouth as needed.   ketoconazole (NIZORAL) 2 % shampoo Apply 1 Application topically 2 (two) times a week.   midodrine (PROAMATINE) 5 MG tablet TAKE 2 TABLETS (10 MG TOTAL) BY MOUTH 3 (THREE) TIMES DAILY AS NEEDED.   Multiple Vitamin (MULTI-VITAMIN) tablet Take 1 tablet by mouth daily.   Omega-3 1000 MG CAPS Take by mouth.   ondansetron (ZOFRAN) 4 MG tablet Take  1 tablet (4 mg total) by mouth every 8 (eight) hours as needed for nausea or vomiting.   oxybutynin (DITROPAN) 5 MG tablet Take by mouth.   Probiotic Product (MAGE) CPDR Take 1 capsule by mouth daily.   saccharomyces boulardii (FLORASTOR) 250 MG capsule Take by mouth.   sodium chloride 0.9 % injection Inject into the vein.   sodium chloride irrigation 0.9 % irrigation Irrigate with as directed.   bisacodyl (MAGIC BULLETS) 10 MG suppository Place 1 suppository (10 mg total) rectally daily as needed for mild constipation or moderate constipation. (Patient not taking: Reported on 06/13/2021)   cyclobenzaprine (FLEXERIL) 10 MG tablet Take 10 mg by mouth 3 (three) times daily as needed.  (Patient not taking: Reported on 02/20/2022)   diclofenac (VOLTAREN) 75 MG EC tablet 1 tablet (75 mg total) 2 (two)  times daily as needed. (Patient not taking: Reported on 06/13/2021)   fosfomycin (MONUROL) 3 g PACK SMARTSIG:1 Packet(s) By Mouth Every Other Day (Patient not taking: Reported on 06/13/2021)   HYDROcodone bit-homatropine (HYCODAN) 5-1.5 MG/5ML syrup Take 5 mLs by mouth every 8 (eight) hours as needed for cough. (Patient not taking: Reported on 02/20/2022)   ibuprofen (ADVIL) 600 MG tablet  (Patient not taking: Reported on 01/09/2022)   MYRBETRIQ 25 MG TB24 tablet Take by mouth. (Patient not taking: Reported on 06/13/2021)  norethindrone (MICRONOR) 0.35 MG tablet Take 1 tablet by mouth daily. (Patient not taking: Reported on 06/13/2021)   traMADol (ULTRAM) 50 MG tablet Take by mouth. (Patient not taking: Reported on 01/09/2022)   No facility-administered encounter medications on file as of 06/26/2022.    Allergies (verified) Sulfa antibiotics, Sulfamethoxazole-trimethoprim, Nitrofurantoin, Baclofen, Morphine and codeine, Oxycodone, Tape, Latex, and Tramadol   History: Past Medical History:  Diagnosis Date   Burst fracture of cervical vertebra (HCC)    History of chicken pox    Quadriplegia (HCC)    Past  Surgical History:  Procedure Laterality Date   Bladder botox     C4-C6 cervical fusion with Decompression or C5 burst fracture  02/12/2014   MOUTH SURGERY     one tooth removed   Reversal of Tracheostomy  03/22/2014   TRACHEOSTOMY  02/26/2014   pallative tracheostomy for respiratory failure secondary to C-spine fracture and paraplegia   Family History  Problem Relation Age of Onset   Ovarian cysts Mother    Dysmenorrhea Mother    Diabetes Mother        type 2; having complications   Hypertension Mother    Bipolar disorder Brother    Schizophrenia Brother    Hypertension Other    Diabetes Other    Social History   Socioeconomic History   Marital status: Single    Spouse name: Not on file   Number of children: 1   Years of education: Not on file   Highest education level: Bachelor's degree (e.g., BA, AB, BS)  Occupational History   Occupation: disabled  Tobacco Use   Smoking status: Never   Smokeless tobacco: Never  Vaping Use   Vaping Use: Never used  Substance and Sexual Activity   Alcohol use: No    Alcohol/week: 0.0 standard drinks of alcohol   Drug use: No   Sexual activity: Not on file    Comment: sexually active at 32 years of age; has had one sexual partner. Used condoms  Other Topics Concern   Not on file  Social History Narrative   Not on file   Social Determinants of Health   Financial Resource Strain: Low Risk  (06/26/2022)   Overall Financial Resource Strain (CARDIA)    Difficulty of Paying Living Expenses: Not hard at all  Food Insecurity: No Food Insecurity (06/26/2022)   Hunger Vital Sign    Worried About Running Out of Food in the Last Year: Never true    Ran Out of Food in the Last Year: Never true  Transportation Needs: No Transportation Needs (06/26/2022)   PRAPARE - Administrator, Civil Service (Medical): No    Lack of Transportation (Non-Medical): No  Physical Activity: Inactive (06/26/2022)   Exercise Vital Sign    Days of  Exercise per Week: 0 days    Minutes of Exercise per Session: 0 min  Stress: No Stress Concern Present (06/26/2022)   [employer] of Occupational Health - Occupational Stress Questionnaire    Feeling of Stress : Only a little  Social Connections: Unknown (06/26/2022)   Social Connection and Isolation Panel [NHANES]    Frequency of Communication with Friends and Family: Twice a week    Frequency of Social Gatherings with Friends and Family: Once a week    Attends Religious Services: Not on Marketing executive or Organizations: No    Attends Banker Meetings: Never    Marital Status: Never married  Tobacco Counseling Counseling given: Not Answered   Clinical Intake:  Pre-visit preparation completed: Yes  Pain : 0-10 Pain Score: 5  Pain Type: Chronic pain Pain Location: Shoulder (neck also) Pain Onset: More than a month ago Pain Frequency: Constant Pain Relieving Factors: relax, medications don't help  Pain Relieving Factors: relax, medications don't help  BMI - recorded: 16.64 Nutritional Status: BMI <19  Underweight Nutritional Risks: Nausea/ vomitting/ diarrhea (often with UTI-Zofran) Diabetes: No  How often do you need to have someone help you when you read instructions, pamphlets, or other written materials from your doctor or pharmacy?: 1 - Never  Diabetic?no  Interpreter Needed?: No  Comments: lives alone-has care givers Information entered by :: B.Proctor Carriker,LPN   Activities of Daily Living    06/26/2022    2:46 PM  In your present state of health, do you have any difficulty performing the following activities:  Hearing? 0  Vision? 1  Difficulty concentrating or making decisions? 0  Walking or climbing stairs? 1  Dressing or bathing? 1  Doing errands, shopping? 1  Preparing Food and eating ? N  Using the Toilet? N  In the past six months, have you accidently leaked urine? Y  Comment s/p tabe  Do you have problems with loss  of bowel control? N  Managing your Medications? Y  Comment pick up or deliver  Managing your Finances? Y  Housekeeping or managing your Housekeeping? Y    Patient Care Team: Malva Limes, MD as PCP - General (Family Medicine) Randalyn Rhea, MD as Referring Physician (Urology) Caro Hight, MD as Referring Physician (Physical Medicine and Rehabilitation)  Indicate any recent Medical Services you may have received from other than [facility] providers in the past year (date may be approximate).     Assessment:   This is a routine wellness examination for [place].  Hearing/Vision screen Hearing Screening - Comments:: Adequate hearing Vision Screening - Comments:: Inadequate vision-needs glasses Dr Gwyndolyn Saxon  Dietary issues and exercise activities discussed: Current Exercise Habits: The patient does not participate in regular exercise at present, Exercise limited by: neurologic condition(s);orthopedic condition(s) (hemi-plegia)   Goals Addressed             This Visit's Progress    DIET - EAT MORE FRUITS AND VEGETABLES   On track    DIET - INCREASE WATER INTAKE   On track    Recommend increasing water intake to 4-6 glasses a day.        Depression Screen    06/26/2022    2:39 PM 06/13/2021    1:07 PM 10/19/2020   10:48 AM 05/13/2019   10:24 AM 04/30/2017    4:22 PM 03/12/2017    2:05 PM  PHQ 2/9 Scores  PHQ - 2 Score 0 0 0 0 4 3  PHQ- 9 Score   3  17 13     Fall Risk    06/26/2022    2:36 PM 06/13/2021    1:09 PM 10/19/2020   10:48 AM 05/13/2019   10:27 AM 03/12/2017    2:05 PM  Fall Risk   Falls in the past year? 0 0 0 0 Yes  Number falls in past yr: 0 0 0 0 1  Injury with Fall? 0 0 0 0 No  Risk for fall due to : No Fall Risks No Fall Risks No Fall Risks    Follow up Education provided;Falls prevention discussed Falls evaluation completed Falls evaluation completed      FALL  RISK PREVENTION PERTAINING TO THE HOME:  Any stairs in or around the  home? No  If so, are there any without handrails? No  Home free of loose throw rugs in walkways, pet beds, electrical cords, etc? Yes  Adequate lighting in your home to reduce risk of falls? Yes   ASSISTIVE DEVICES UTILIZED TO PREVENT FALLS:  Life alert? No  Use of a cane, walker or w/c? No  Grab bars in the bathroom? Yes  Shower chair or bench in shower? Yes  Elevated toilet seat or a handicapped toilet? Yes    Cognitive Function:        06/26/2022    2:49 PM 05/13/2019   10:35 AM  6CIT Screen  What Year? 0 points 0 points  What month? 0 points 0 points  What time? 0 points 0 points  Count back from 20 0 points 0 points  Months in reverse 4 points 0 points  Repeat phrase 0 points 0 points  Total Score 4 points 0 points    Immunizations Immunization History  Administered Date(s) Administered   HPV Quadrivalent 10/20/2009, 01/18/2010, 05/25/2010   Hepatitis A 10/20/2009, 05/25/2010   Meningococcal Conjugate 10/20/2009   Tdap 10/20/2009    TDAP status: Up to date  Flu Vaccine status: Declined, Education has been provided regarding the importance of this vaccine but patient still declined. Advised may receive this vaccine at local pharmacy or Health Dept. Aware to provide a copy of the vaccination record if obtained from local pharmacy or Health Dept. Verbalized acceptance and understanding.     Covid-19 vaccine status: Declined, Education has been provided regarding the importance of this vaccine but patient still declined. Advised may receive this vaccine at local pharmacy or Health Dept.or vaccine clinic. Aware to provide a copy of the vaccination record if obtained from local pharmacy or Health Dept. Verbalized acceptance and understanding.  Qualifies for Shingles Vaccine? No      Screening Tests Health Maintenance  Topic Date Due   COVID-19 Vaccine (1) Never done   HIV Screening  Never done   PAP SMEAR-Modifier  06/09/2019   DTaP/Tdap/Td (2 - Td or Tdap)  10/21/2019   INFLUENZA VACCINE  09/07/2022   Medicare Annual Wellness (AWV)  06/26/2023   HPV VACCINES  Completed   Hepatitis C Screening  Completed    Health Maintenance  Health Maintenance Due  Topic Date Due   COVID-19 Vaccine (1) Never done   HIV Screening  Never done   PAP SMEAR-Modifier  06/09/2019   DTaP/Tdap/Td (2 - Td or Tdap) 10/21/2019    Lung Cancer Screening: (Low Dose CT Chest recommended if Age 78-80 years, 30 pack-year currently smoking OR have quit w/in 15years.) no qualify.   Lung Cancer Screening Referral: no  Additional Screening:  Hepatitis C Screening: does not qualify; Completed yes  Vision Screening: Recommended annual ophthalmology exams for early detection of glaucoma and other disorders of the eye. Is the patient up to date with their annual eye exam?  Yes  Who is the provider or what is the name of the office in which the patient attends annual eye exams? Dr Gwyndolyn Saxon If pt is not established with a provider, would they like to be referred to a provider to establish care? No .   Dental Screening: Recommended annual dental exams for proper oral hygiene  Community Resource Referral / Chronic Care Management: CRR required this visit?  no  CCM required this visit?  Yes     Plan:  I have personally reviewed and noted the following in the patient's chart:   Medical and social history Use of alcohol, tobacco or illicit drugs  Current medications and supplements including opioid prescriptions. Patient is not currently taking opioid prescriptions. Functional ability and status Nutritional status Physical activity Advanced directives List of other physicians Hospitalizations, surgeries, and ER visits in previous 12 months Vitals Screenings to include cognitive, depression, and falls Referrals and appointments  In addition, I have reviewed and discussed with patient certain preventive protocols, quality metrics, and best practice  recommendations. A written personalized care plan for preventive services as well as general preventive health recommendations were provided to patient.     Sue Lush, LPN   8/[id number]/[id number]   Nurse Notes: pt is quadriplegic who relays she lives alone with exception of caregivers that care for her. She relays having vision problems and needing glasses but unable to afford the exam (insurance does not pay for). Pt inquires about needing transportation other than to medical appts. She feels isolated more with limited means to get out and enjoy. Pt expresses desire to have a Zenaida Niece that can be specifically made for her to drive. She has no questions or concerns at this time.  *referral for CCM made

## 2022-06-29 NOTE — Progress Notes (Signed)
AWV on 06/26/2022 2:30pm done:  I connected with  Stefano Gaul on 06/26/22 by a audio enabled telemedicine application and verified that I am speaking with the correct person using two identifiers.  Patient Location: Home  Provider Location: Office/Clinic  I discussed the limitations of evaluation and management by telemedicine. The patient expressed understanding and agreed to proceed.

## 2022-06-29 NOTE — Progress Notes (Signed)
  Care Coordination   Note   06/29/2022 Name: [name] MRN: [id number] DOB: 1990-11-05  Cassandra Lowe is a 32 y.o. year old female who sees Fisher, Demetrios Isaacs, MD for primary care. I reached out to Cassandra Lowe by phone today to offer care coordination services.  [name] was given information about Care Coordination services today including:   The Care Coordination services include support from the care team which includes your Nurse Coordinator, Clinical Social Worker, or Pharmacist.  The Care Coordination team is here to help remove barriers to the health concerns and goals most important to you. Care Coordination services are voluntary, and the patient may decline or stop services at any time by request to their care team member.   Care Coordination Consent Status: Patient did not agree to participate in care coordination services at this time.  Follow up plan:  pt appreciative but declines need for RN/BSW - needing financial resources for out of pocket expenses for glasses - message sent to community resource care guides  Encounter Outcome:  Pt. Refused  Burman Nieves, CCMA Care Coordination Care Guide Direct Dial: [phone]

## 2022-06-30 ENCOUNTER — Telehealth: Payer: Self-pay | Admitting: *Deleted

## 2022-06-30 NOTE — Telephone Encounter (Signed)
   Telephone encounter was:  Unsuccessful.  06/30/2022 Name: Cassandra Lowe MRN: [id number] DOB: [birth year]  Unsuccessful outbound call made today to assist with:  Food Insecurity  Outreach Attempt:  1st Attempt  A HIPAA compliant voice message was left requesting a return call.  Instructed patient to call back at [phone]. Yehuda Mao Greenauer -[facility], Population Health [phone] [address] [place] , [place] [address] Email : [email or web address]

## 2022-07-05 ENCOUNTER — Telehealth: Payer: Self-pay | Admitting: *Deleted

## 2022-07-05 NOTE — Telephone Encounter (Signed)
   Telephone encounter was:  Successful.  07/05/2022 Name: [name] MRN: [id number] DOB: 21-Jun-1990  Cassandra Lowe is a 32 y.o. year old female who is a primary care patient of Sherrie Mustache, Demetrios Isaacs, MD . The community resource team was consulted for assistance with  eyeglasses  Provided information on how to cut the cost of her medicaid glasses  Care guide performed the following interventions: Patient provided with information about care guide support team and interviewed to confirm resource needs.  Follow Up Plan:  No further follow up planned at this time. The patient has been provided with needed resources. Yehuda Mao Greenauer -[facility], Population Health [phone] [address] [place] , [place] [address] Email : [email or web address]

## 2022-07-21 DIAGNOSIS — R339 Retention of urine, unspecified: Secondary | ICD-10-CM | POA: Diagnosis not present

## 2022-07-22 DIAGNOSIS — R339 Retention of urine, unspecified: Secondary | ICD-10-CM | POA: Diagnosis not present

## 2022-07-24 ENCOUNTER — Telehealth (INDEPENDENT_AMBULATORY_CARE_PROVIDER_SITE_OTHER): Payer: 59 | Admitting: Family Medicine

## 2022-07-24 ENCOUNTER — Telehealth: Payer: Self-pay | Admitting: Family Medicine

## 2022-07-24 DIAGNOSIS — R110 Nausea: Secondary | ICD-10-CM | POA: Diagnosis not present

## 2022-07-24 MED ORDER — HYOSCYAMINE SULFATE 0.125 MG PO TBDP: 0.1250 mg | ORAL_TABLET | ORAL | 0 refills | Status: DC | PRN

## 2022-07-24 MED ORDER — PROMETHAZINE HCL 12.5 MG PO TABS: 12.5000 mg | ORAL_TABLET | Freq: Four times a day (QID) | ORAL | 0 refills | Status: DC | PRN

## 2022-07-24 NOTE — Progress Notes (Signed)
Established Video Visit   Patient: Cassandra Lowe   DOB: 07-14-1990   32 y.o. Female  MRN: [id number] Visit Date: 07/24/2022  Today's healthcare provider: Mila Merry, MD    Patient location: home Provider location: bfp  I discussed the limitations of evaluation and management by telemedicine and the availability of in person appointments. The patient expressed understanding and agreed to proceed.    Chief Complaint  Patient presents with   Nausea    Diarrhea, nausea, stomach pain, nerve pain--Tried gas-x, imodium.  Since camelback from vacation last Sunday.   Subjective    Discussed the use of AI scribe software for clinical note transcription with the patient, who gave verbal consent to proceed.  History of Present Illness   The patient, with a history of paralysis, presents with a week-long history of severe abdominal pain, diarrhea, and nausea. The symptoms began after a trip to [place], during which she consumed tap water, which she does not usually drink. Their husband also experienced diarrhea after the trip, but their symptoms resolved after two days. The patient's abdominal pain is described as 'excruciating,' and she has been vomiting gas due to difficulty passing gas rectally due to her paralysis. She has been managing her symptoms with over-the-counter medications and has stopped taking all supplements to rule out a possible cause. Despite these measures, her symptoms have not improved, and she has been unable to sleep due to the severity of her pain. She has been maintaining hydration and has been eating a bland diet of avocado, rice, broth, and wheat seeded bread when she can tolerate it. She denies any blood in her stool.       Medications: Outpatient Medications Prior to Visit  Medication Sig   albuterol (PROVENTIL HFA;VENTOLIN HFA) 108 (90 Base) MCG/ACT inhaler Inhale 2 puffs into the lungs every 6 (six) hours as needed for wheezing or shortness of  breath.   Ascorbic Acid (VITAMIN C) 100 MG tablet Take 100 mg by mouth daily. When remembers   bisacodyl (MAGIC BULLETS) 10 MG suppository Place 1 suppository (10 mg total) rectally daily as needed for mild constipation or moderate constipation.   cyclobenzaprine (FLEXERIL) 10 MG tablet Take 10 mg by mouth 3 (three) times daily as needed.   diclofenac (VOLTAREN) 75 MG EC tablet    EPINEPHrine 0.3 mg/0.3 mL IJ SOAJ injection Inject 0.3 mg into the muscle as needed for anaphylaxis.   Fluocinolone Acetonide Scalp 0.01 % OIL Apply topically daily.   fosfomycin (MONUROL) 3 g PACK    Ginger, Zingiber officinalis, (GINGER PO) Take by mouth as needed.   HYDROcodone bit-homatropine (HYCODAN) 5-1.5 MG/5ML syrup Take 5 mLs by mouth every 8 (eight) hours as needed for cough.   ibuprofen (ADVIL) 600 MG tablet    ketoconazole (NIZORAL) 2 % shampoo Apply 1 Application topically 2 (two) times a week.   midodrine (PROAMATINE) 5 MG tablet TAKE 2 TABLETS (10 MG TOTAL) BY MOUTH 3 (THREE) TIMES DAILY AS NEEDED.   Multiple Vitamin (MULTI-VITAMIN) tablet Take 1 tablet by mouth daily.   MYRBETRIQ 25 MG TB24 tablet Take by mouth.   norethindrone (MICRONOR) 0.35 MG tablet Take 1 tablet by mouth daily.   Omega-3 1000 MG CAPS Take by mouth.   ondansetron (ZOFRAN) 4 MG tablet Take 1 tablet (4 mg total) by mouth every 8 (eight) hours as needed for nausea or vomiting.   oxybutynin (DITROPAN) 5 MG tablet Take by mouth.   Probiotic Product (MAGE) CPDR  Take 1 capsule by mouth daily.   saccharomyces boulardii (FLORASTOR) 250 MG capsule Take by mouth.   sodium chloride 0.9 % injection Inject into the vein.   sodium chloride irrigation 0.9 % irrigation Irrigate with as directed.   traMADol (ULTRAM) 50 MG tablet Take by mouth.   No facility-administered medications prior to visit.   Review of Systems  Constitutional:  Positive for fatigue. Negative for appetite change, chills and fever.  Respiratory:  Negative for chest  tightness and shortness of breath.   Cardiovascular:  Negative for chest pain and palpitations.  Gastrointestinal:  Positive for abdominal pain, nausea and vomiting.  Neurological:  Positive for weakness. Negative for dizziness.       Objective    There were no vitals taken for this visit.   Physical Exam  Physical Exam        No results found for any visits on 07/24/22.  Assessment & Plan     Assessment and Plan    Gastroenteritis: Severe abdominal pain, nausea, vomiting, and diarrhea for one week following a trip to [place]. No hematochezia. Concerning for bacterial or parasitic infection due to the duration of symptoms and recent travel. However she feels strongly that symptoms are lingering due to her mobility issues. -Prescribe Hyoscyamine for intestinal spasms. -Prescribe Phenergan for nausea. -Advise patient to continue over-the-counter Simethicone for gas. -Recommend hospital visit for testing if symptoms do not improve rapidly.  Patient mentioned trying to conceive. -All prescribed medications cannot be ruled out to cause fetal risk. Patient has been informed and consents to use.  Follow-up: -Strongly advised that she seek immediate medical attention at ER if symptoms do not rapidly improve.         I discussed the assessment and treatment plan with the patient. The patient was provided an opportunity to ask questions and all were answered. The patient agreed with the plan and demonstrated an understanding of the instructions.   Mila Merry, MD  [facility] [facility] [phone] (phone) [phone] (fax)  [facility]

## 2022-07-31 ENCOUNTER — Telehealth: Payer: Self-pay | Admitting: Family Medicine

## 2022-07-31 ENCOUNTER — Other Ambulatory Visit: Payer: Self-pay | Admitting: Family Medicine

## 2022-07-31 DIAGNOSIS — R110 Nausea: Secondary | ICD-10-CM

## 2022-07-31 NOTE — Telephone Encounter (Signed)
Patient requests new prescription for midodrine, with directions stating to take 3 pills, 3 times a day.

## 2022-07-31 NOTE — Telephone Encounter (Addendum)
Patient called in stating she needed an updated script, that it is currently not covered by insurance due to below. Patient stated she only has a couple days left of this. Patient stated [facility] has sent over a request for this also.   She stated she takes 3 pills 3 times daily but it is wrote for 2 pills 3 times daily.  [id number] - [place] - [address] [[id number]]   midodrine (PROAMATINE) 5 MG tablet   Also requesting refill for promethazine (PHENERGAN) 12.5 MG tablet for nausea at bed time (from her appointment with Dr Sherrie Mustache on 07/24/2022 for nausea) but stated over all she is doing better.   Patients callback #:  [phone]

## 2022-07-31 NOTE — Telephone Encounter (Signed)
[facility] is requesting new RX midodrine (PROAMATINE) 5 MG tablet  Please advise

## 2022-08-01 MED ORDER — PROMETHAZINE HCL 12.5 MG PO TABS: 12.5000 mg | ORAL_TABLET | Freq: Four times a day (QID) | ORAL | 0 refills | Status: DC | PRN

## 2022-08-02 ENCOUNTER — Telehealth: Payer: Self-pay | Admitting: Family Medicine

## 2022-08-02 NOTE — Telephone Encounter (Signed)
[facility] requesting new RX with updated instructions midodrine (PROAMATINE) 5 MG tablet  Please advise

## 2022-08-03 ENCOUNTER — Other Ambulatory Visit: Payer: Self-pay

## 2022-08-08 MED ORDER — MIDODRINE HCL 5 MG PO TABS: 10.0000 mg | ORAL_TABLET | Freq: Three times a day (TID) | ORAL | 4 refills | Status: DC | PRN

## 2022-08-21 DIAGNOSIS — R339 Retention of urine, unspecified: Secondary | ICD-10-CM | POA: Diagnosis not present

## 2022-09-06 DIAGNOSIS — N319 Neuromuscular dysfunction of bladder, unspecified: Secondary | ICD-10-CM | POA: Diagnosis not present

## 2022-09-06 DIAGNOSIS — N390 Urinary tract infection, site not specified: Secondary | ICD-10-CM | POA: Diagnosis not present

## 2022-09-12 DIAGNOSIS — R339 Retention of urine, unspecified: Secondary | ICD-10-CM | POA: Diagnosis not present

## 2022-09-13 DIAGNOSIS — N319 Neuromuscular dysfunction of bladder, unspecified: Secondary | ICD-10-CM | POA: Diagnosis not present

## 2022-09-13 DIAGNOSIS — Z9359 Other cystostomy status: Secondary | ICD-10-CM | POA: Diagnosis not present

## 2022-09-13 DIAGNOSIS — K769 Liver disease, unspecified: Secondary | ICD-10-CM | POA: Diagnosis not present

## 2022-09-19 DIAGNOSIS — K769 Liver disease, unspecified: Secondary | ICD-10-CM | POA: Diagnosis not present

## 2022-09-27 DIAGNOSIS — S31109A Unspecified open wound of abdominal wall, unspecified quadrant without penetration into peritoneal cavity, initial encounter: Secondary | ICD-10-CM | POA: Diagnosis not present

## 2022-10-05 DIAGNOSIS — S31109A Unspecified open wound of abdominal wall, unspecified quadrant without penetration into peritoneal cavity, initial encounter: Secondary | ICD-10-CM | POA: Diagnosis not present

## 2022-10-05 DIAGNOSIS — R339 Retention of urine, unspecified: Secondary | ICD-10-CM | POA: Diagnosis not present

## 2022-10-06 DIAGNOSIS — N319 Neuromuscular dysfunction of bladder, unspecified: Secondary | ICD-10-CM | POA: Diagnosis not present

## 2022-10-06 DIAGNOSIS — N390 Urinary tract infection, site not specified: Secondary | ICD-10-CM | POA: Diagnosis not present

## 2022-10-09 ENCOUNTER — Other Ambulatory Visit: Payer: Self-pay | Admitting: Family Medicine

## 2022-10-11 NOTE — Telephone Encounter (Signed)
Requested medication (s) are due for refill today: routing for approval  Requested medication (s) are on the active medication list: yes  Last refill:  08/08/22  Future visit scheduled: no  Notes to clinic:  Unable to refill per protocol, cannot delegate.      Requested Prescriptions  Pending Prescriptions Disp Refills   midodrine (PROAMATINE) 5 MG tablet [Pharmacy Med Name: MIDODRINE HCL 5 MG TABLET] 180 tablet 1    Sig: TAKE 2 TABLETS BY MOUTH THREE TIMES DAILY AS NEEDED     Not Delegated - Cardiovascular: Midodrine Failed - 10/09/2022  1:25 AM      Failed - This refill cannot be delegated      Failed - Cr in normal range and within 360 days    Creatinine  Date Value Ref Range Status  02/11/2014 0.83 0.60 - 1.30 mg/dL Final   Creatinine, Ser  Date Value Ref Range Status  07/24/2015 0.55 0.44 - 1.00 mg/dL Final         Failed - ALT in normal range and within 360 days    ALT  Date Value Ref Range Status  07/24/2015 16 14 - 54 U/L Final   SGPT (ALT)  Date Value Ref Range Status  02/11/2014 25 U/L Final    Comment:    14-63 NOTE: New Reference Range 08/26/13          Failed - AST in normal range and within 360 days    AST  Date Value Ref Range Status  07/24/2015 21 15 - 41 U/L Final   SGOT(AST)  Date Value Ref Range Status  02/11/2014 30 15 - 37 Unit/L Final         Passed - Last BP in normal range    BP Readings from Last 1 Encounters:  02/20/22 (!) 100/55         Passed - Valid encounter within last 12 months    Recent Outpatient Visits           2 months ago Nausea   [place] [facility], [facility]   7 months ago Mobility impaired   [facility], [facility]   9 months ago Upper respiratory tract infection, unspecified type   [facility]   1 year ago Acute midline low back pain with bilateral sciatica   [place] [facility] Malva Limes, MD   1 year ago Adverse effect of vaccine, initial encounter   [facility] Malva Limes, MD

## 2022-11-03 DIAGNOSIS — R339 Retention of urine, unspecified: Secondary | ICD-10-CM | POA: Diagnosis not present

## 2022-11-03 DIAGNOSIS — S31109A Unspecified open wound of abdominal wall, unspecified quadrant without penetration into peritoneal cavity, initial encounter: Secondary | ICD-10-CM | POA: Diagnosis not present

## 2022-11-04 ENCOUNTER — Other Ambulatory Visit: Payer: Self-pay | Admitting: Family Medicine

## 2022-11-06 DIAGNOSIS — N390 Urinary tract infection, site not specified: Secondary | ICD-10-CM | POA: Diagnosis not present

## 2022-11-06 DIAGNOSIS — N319 Neuromuscular dysfunction of bladder, unspecified: Secondary | ICD-10-CM | POA: Diagnosis not present

## 2022-11-06 NOTE — Telephone Encounter (Signed)
Requested medication (s) are due for refill today - no  Requested medication (s) are on the active medication list -yes  Future visit scheduled -no  Last refill: 10/11/22 #180 1RF  Notes to clinic: Pharmacy request: 90 day Rx, non delegated Rx  Requested Prescriptions  Pending Prescriptions Disp Refills   midodrine (PROAMATINE) 5 MG tablet [Pharmacy Med Name: MIDODRINE HCL 5 MG TABLET] 540 tablet 1    Sig: TAKE 2 TABLETS BY MOUTH THREE TIMES DAILY AS NEEDED     Not Delegated - Cardiovascular: Midodrine Failed - 11/04/2022  2:25 PM      Failed - This refill cannot be delegated      Failed - Cr in normal range and within 360 days    Creatinine  Date Value Ref Range Status  02/11/2014 0.83 0.60 - 1.30 mg/dL Final   Creatinine, Ser  Date Value Ref Range Status  07/24/2015 0.55 0.44 - 1.00 mg/dL Final         Failed - ALT in normal range and within 360 days    ALT  Date Value Ref Range Status  07/24/2015 16 14 - 54 U/L Final   SGPT (ALT)  Date Value Ref Range Status  02/11/2014 25 U/L Final    Comment:    14-63 NOTE: New Reference Range 08/26/13          Failed - AST in normal range and within 360 days    AST  Date Value Ref Range Status  07/24/2015 21 15 - 41 U/L Final   SGOT(AST)  Date Value Ref Range Status  02/11/2014 30 15 - 37 Unit/L Final         Passed - Last BP in normal range    BP Readings from Last 1 Encounters:  02/20/22 (!) 100/55         Passed - Valid encounter within last 12 months    Recent Outpatient Visits           3 months ago Nausea   [place] [facility] Mal[facility] Limes, MD   8 months ago Mobility impaired   [facility] Malva Limes, MD   10 months ago Upper respiratory tract infection, unspecified type   [facility] Malva Limes, MD   1 year ago Acute midline low back pain with bilateral sciatica   [place] [facility] Malva Limes, MD   2 years ago Adverse effect of vaccine, initial encounter   [facility] Malva Limes, MD                 Requested Prescriptions  Pending Prescriptions Disp Refills   midodrine (PROAMATINE) 5 MG tablet [Pharmacy Med Name: MIDODRINE HCL 5 MG TABLET] 540 tablet 1    Sig: TAKE 2 TABLETS BY MOUTH THREE TIMES DAILY AS NEEDED     Not Delegated - Cardiovascular: Midodrine Failed - 11/04/2022  2:25 PM      Failed - This refill cannot be delegated      Failed - Cr in normal range and within 360 days    Creatinine  Date Value Ref Range Status  02/11/2014 0.83 0.60 - 1.30 mg/dL Final   Creatinine, Ser  Date Value Ref Range Status  07/24/2015 0.55 0.44 - 1.00 mg/dL Final         Failed - ALT in normal range and within 360 days    ALT  Date Value  Ref Range Status  07/24/2015 16 14 - 54 U/L Final   SGPT (ALT)  Date Value Ref Range Status  02/11/2014 25 U/L Final    Comment:    14-63 NOTE: New Reference Range 08/26/13          Failed - AST in normal range and within 360 days    AST  Date Value Ref Range Status  07/24/2015 21 15 - 41 U/L Final   SGOT(AST)  Date Value Ref Range Status  02/11/2014 30 15 - 37 Unit/L Final         Passed - Last BP in normal range    BP Readings from Last 1 Encounters:  02/20/22 (!) 100/55         Passed - Valid encounter within last 12 months    Recent Outpatient Visits           3 months ago Nausea   [facility] Malva Limes, MD   8 months ago Mobility impaired   [facility] Malva L[facility], MD   10 months ago Upper respiratory tract infection, unspecified type   [facility], [facility]   1 year ago Acute midline low back pain with bilateral sciatica   [place] [facility] Malva Limes, MD   2 years ago Adverse effect of vaccine, initial encounter   [facility] Malva Limes, MD

## 2022-12-01 ENCOUNTER — Telehealth: Payer: Self-pay

## 2022-12-01 ENCOUNTER — Telehealth: Payer: 59 | Admitting: Family Medicine

## 2022-12-01 NOTE — Telephone Encounter (Signed)
Copied from CRM [id number]. Topic: General - Inquiry >> Dec 01, 2022 12:21 PM Marlow Baars wrote: Reason for CRM: The patient called stating Lester Roundup with [employer] will be faxing over updated paperwork concerning a scooter trailer for her power wheelchair for her car. Please assist patient as soon as possible. She would like an update when the paperwork is done and sent please.

## 2022-12-01 NOTE — Telephone Encounter (Signed)
FYI

## 2022-12-04 DIAGNOSIS — R339 Retention of urine, unspecified: Secondary | ICD-10-CM | POA: Diagnosis not present

## 2022-12-06 DIAGNOSIS — N390 Urinary tract infection, site not specified: Secondary | ICD-10-CM | POA: Diagnosis not present

## 2022-12-06 DIAGNOSIS — N319 Neuromuscular dysfunction of bladder, unspecified: Secondary | ICD-10-CM | POA: Diagnosis not present

## 2023-01-05 DIAGNOSIS — N319 Neuromuscular dysfunction of bladder, unspecified: Secondary | ICD-10-CM | POA: Diagnosis not present

## 2023-01-05 DIAGNOSIS — N390 Urinary tract infection, site not specified: Secondary | ICD-10-CM | POA: Diagnosis not present

## 2023-01-10 DIAGNOSIS — G8250 Quadriplegia, unspecified: Secondary | ICD-10-CM | POA: Diagnosis not present

## 2023-01-18 DIAGNOSIS — R339 Retention of urine, unspecified: Secondary | ICD-10-CM | POA: Diagnosis not present

## 2023-02-05 DIAGNOSIS — N390 Urinary tract infection, site not specified: Secondary | ICD-10-CM | POA: Diagnosis not present

## 2023-02-05 DIAGNOSIS — N319 Neuromuscular dysfunction of bladder, unspecified: Secondary | ICD-10-CM | POA: Diagnosis not present

## 2023-02-06 ENCOUNTER — Other Ambulatory Visit: Payer: Self-pay | Admitting: Family Medicine

## 2023-02-06 DIAGNOSIS — R110 Nausea: Secondary | ICD-10-CM

## 2023-02-06 NOTE — Telephone Encounter (Signed)
 Medication Refill -  Most Recent Primary Care Visit:  Provider: GASPER NANCYANN BRAVO  Department: [facility]  Visit Type: MYCHART VIDEO VISIT  Date: 07/24/2022  Medication: promethazine  (PHENERGAN ) 12.5 MG tablet   Has the patient contacted their pharmacy? no Pt thought she had to call in, since it says no refills, I advised she can still call pharmacy when it shows this in the future  Is this the correct pharmacy for this prescription? yes  This is the patient's preferred pharmacy:  [id number] [address] [place], [place] - [phone] AT [facility] OF [facility] & [address] [place] [address] Phone: [phone] Fax: [phone]     Has the prescription been filled recently? no  Is the patient out of the medication? yes  Has the patient been seen for an appointment in the last year OR does the patient have an upcoming appointment? yes  Can we respond through MyChart? yes  Agent: Please be advised that Rx refills may take up to 3 business days. We ask that you follow-up with your pharmacy.

## 2023-02-10 NOTE — Telephone Encounter (Signed)
 Requested medication (s) are due for refill today: Yes  Requested medication (s) are on the active medication list: Yes  Last refill:  08/01/22  Future visit scheduled: No  Notes to clinic:  Unable to refill per protocol, cannot delegate.      Requested Prescriptions  Pending Prescriptions Disp Refills   promethazine  (PHENERGAN ) 12.5 MG tablet 10 tablet 0    Sig: Take 1 tablet (12.5 mg total) by mouth every 6 (six) hours as needed for nausea or vomiting.     Not Delegated - Gastroenterology: Antiemetics Failed - 02/10/2023 10:59 AM      Failed - This refill cannot be delegated      Failed - Valid encounter within last 6 months    Recent Outpatient Visits           6 months ago Nausea   [facility] Gasper Nancyann BRAVO, MD   11 months ago Mobility impaired   [facility] Gasper Nancyann BRAVO, MD   1 year ago Upper respiratory tract infection, unspecified type   [facility] Gasper Nancyann BRAVO, MD   1 year ago Acute midline low back pain with bilateral sciatica   [place] [facility] Gasper Nancyann BRAVO, MD   2 years ago Adverse effect of vaccine, initial encounter   [facility] Gasper Nancyann BRAVO, MD

## 2023-02-14 MED ORDER — PROMETHAZINE HCL 12.5 MG PO TABS: 12.5000 mg | ORAL_TABLET | Freq: Four times a day (QID) | ORAL | 4 refills | Status: DC | PRN

## 2023-02-17 DIAGNOSIS — R339 Retention of urine, unspecified: Secondary | ICD-10-CM | POA: Diagnosis not present

## 2023-02-17 DIAGNOSIS — S31109A Unspecified open wound of abdominal wall, unspecified quadrant without penetration into peritoneal cavity, initial encounter: Secondary | ICD-10-CM | POA: Diagnosis not present

## 2023-02-23 DIAGNOSIS — G8250 Quadriplegia, unspecified: Secondary | ICD-10-CM | POA: Diagnosis not present

## 2023-03-02 DIAGNOSIS — Z8489 Family history of other specified conditions: Secondary | ICD-10-CM | POA: Diagnosis not present

## 2023-03-02 DIAGNOSIS — G8250 Quadriplegia, unspecified: Secondary | ICD-10-CM | POA: Diagnosis not present

## 2023-03-02 DIAGNOSIS — Z681 Body mass index (BMI) 19 or less, adult: Secondary | ICD-10-CM | POA: Diagnosis not present

## 2023-03-02 DIAGNOSIS — N390 Urinary tract infection, site not specified: Secondary | ICD-10-CM | POA: Diagnosis not present

## 2023-03-09 DIAGNOSIS — N390 Urinary tract infection, site not specified: Secondary | ICD-10-CM | POA: Diagnosis not present

## 2023-03-09 DIAGNOSIS — N319 Neuromuscular dysfunction of bladder, unspecified: Secondary | ICD-10-CM | POA: Diagnosis not present

## 2023-03-20 DIAGNOSIS — R339 Retention of urine, unspecified: Secondary | ICD-10-CM | POA: Diagnosis not present

## 2023-03-28 DIAGNOSIS — N319 Neuromuscular dysfunction of bladder, unspecified: Secondary | ICD-10-CM | POA: Diagnosis not present

## 2023-03-28 DIAGNOSIS — R82998 Other abnormal findings in urine: Secondary | ICD-10-CM | POA: Diagnosis not present

## 2023-03-28 DIAGNOSIS — N3289 Other specified disorders of bladder: Secondary | ICD-10-CM | POA: Diagnosis not present

## 2023-03-28 DIAGNOSIS — N390 Urinary tract infection, site not specified: Secondary | ICD-10-CM | POA: Diagnosis not present

## 2023-03-30 DIAGNOSIS — G8250 Quadriplegia, unspecified: Secondary | ICD-10-CM | POA: Diagnosis not present

## 2023-03-30 DIAGNOSIS — Z681 Body mass index (BMI) 19 or less, adult: Secondary | ICD-10-CM | POA: Diagnosis not present

## 2023-03-30 DIAGNOSIS — N390 Urinary tract infection, site not specified: Secondary | ICD-10-CM | POA: Diagnosis not present

## 2023-04-11 DIAGNOSIS — N390 Urinary tract infection, site not specified: Secondary | ICD-10-CM | POA: Diagnosis not present

## 2023-04-11 DIAGNOSIS — N319 Neuromuscular dysfunction of bladder, unspecified: Secondary | ICD-10-CM | POA: Diagnosis not present

## 2023-04-18 DIAGNOSIS — R339 Retention of urine, unspecified: Secondary | ICD-10-CM | POA: Diagnosis not present

## 2023-04-24 DIAGNOSIS — R339 Retention of urine, unspecified: Secondary | ICD-10-CM | POA: Diagnosis not present

## 2023-04-27 DIAGNOSIS — N390 Urinary tract infection, site not specified: Secondary | ICD-10-CM | POA: Diagnosis not present

## 2023-04-27 DIAGNOSIS — G8250 Quadriplegia, unspecified: Secondary | ICD-10-CM | POA: Diagnosis not present

## 2023-04-27 DIAGNOSIS — Z8489 Family history of other specified conditions: Secondary | ICD-10-CM | POA: Diagnosis not present

## 2023-04-27 DIAGNOSIS — Z681 Body mass index (BMI) 19 or less, adult: Secondary | ICD-10-CM | POA: Diagnosis not present

## 2023-05-11 DIAGNOSIS — N390 Urinary tract infection, site not specified: Secondary | ICD-10-CM | POA: Diagnosis not present

## 2023-05-11 DIAGNOSIS — Z681 Body mass index (BMI) 19 or less, adult: Secondary | ICD-10-CM | POA: Diagnosis not present

## 2023-05-11 DIAGNOSIS — G8250 Quadriplegia, unspecified: Secondary | ICD-10-CM | POA: Diagnosis not present

## 2023-05-13 DIAGNOSIS — N319 Neuromuscular dysfunction of bladder, unspecified: Secondary | ICD-10-CM | POA: Diagnosis not present

## 2023-05-13 DIAGNOSIS — N390 Urinary tract infection, site not specified: Secondary | ICD-10-CM | POA: Diagnosis not present

## 2023-05-21 DIAGNOSIS — R339 Retention of urine, unspecified: Secondary | ICD-10-CM | POA: Diagnosis not present

## 2023-05-26 DIAGNOSIS — R339 Retention of urine, unspecified: Secondary | ICD-10-CM | POA: Diagnosis not present

## 2023-06-04 DIAGNOSIS — R42 Dizziness and giddiness: Secondary | ICD-10-CM | POA: Diagnosis not present

## 2023-06-04 DIAGNOSIS — Z881 Allergy status to other antibiotic agents status: Secondary | ICD-10-CM | POA: Diagnosis not present

## 2023-06-04 DIAGNOSIS — G8254 Quadriplegia, C5-C7 incomplete: Secondary | ICD-10-CM | POA: Diagnosis not present

## 2023-06-04 DIAGNOSIS — Z885 Allergy status to narcotic agent status: Secondary | ICD-10-CM | POA: Diagnosis not present

## 2023-06-04 DIAGNOSIS — Z87440 Personal history of urinary (tract) infections: Secondary | ICD-10-CM | POA: Diagnosis not present

## 2023-06-04 DIAGNOSIS — Z91040 Latex allergy status: Secondary | ICD-10-CM | POA: Diagnosis not present

## 2023-06-04 DIAGNOSIS — Z888 Allergy status to other drugs, medicaments and biological substances status: Secondary | ICD-10-CM | POA: Diagnosis not present

## 2023-06-04 DIAGNOSIS — G909 Disorder of the autonomic nervous system, unspecified: Secondary | ICD-10-CM | POA: Diagnosis not present

## 2023-06-04 DIAGNOSIS — N319 Neuromuscular dysfunction of bladder, unspecified: Secondary | ICD-10-CM | POA: Diagnosis not present

## 2023-06-04 DIAGNOSIS — D649 Anemia, unspecified: Secondary | ICD-10-CM | POA: Diagnosis not present

## 2023-06-05 LAB — LAB REPORT - SCANNED

## 2023-06-11 DIAGNOSIS — N3289 Other specified disorders of bladder: Secondary | ICD-10-CM | POA: Diagnosis not present

## 2023-06-11 DIAGNOSIS — Z9359 Other cystostomy status: Secondary | ICD-10-CM | POA: Diagnosis not present

## 2023-06-11 DIAGNOSIS — N390 Urinary tract infection, site not specified: Secondary | ICD-10-CM | POA: Diagnosis not present

## 2023-06-11 DIAGNOSIS — N319 Neuromuscular dysfunction of bladder, unspecified: Secondary | ICD-10-CM | POA: Diagnosis not present

## 2023-06-13 ENCOUNTER — Ambulatory Visit: Payer: 59

## 2023-06-13 DIAGNOSIS — Z0000 Encounter for general adult medical examination without abnormal findings: Secondary | ICD-10-CM

## 2023-06-13 NOTE — Progress Notes (Signed)
 Subjective:   Cassandra Lowe is a 33 y.o. who presents for a Medicare Wellness preventive visit.  Visit Complete: Virtual I connected with  Cassandra Lowe on 06/13/23 by a audio enabled telemedicine application and verified that I am speaking with the correct person using two identifiers.  Patient Location: Home  Provider Location: Home Office  I discussed the limitations of evaluation and management by telemedicine. The patient expressed understanding and agreed to proceed.  Vital Signs: Because this visit was a virtual/telehealth visit, some criteria may be missing or patient reported. Any vitals not documented were not able to be obtained and vitals that have been documented are patient reported.  VideoDeclined- This patient declined Librarian, academic. Therefore the visit was completed with audio only.  Persons Participating in Visit: Patient.  AWV Questionnaire: No: Patient Medicare AWV questionnaire was not completed prior to this visit.  Cardiac Risk Factors include: sedentary lifestyle     Objective:    There were no vitals filed for this visit. There is no height or weight on file to calculate BMI.     06/13/2023    4:04 PM 06/26/2022    2:46 PM 06/13/2021    1:08 PM 05/13/2019   10:27 AM 04/30/2017    5:32 PM 03/12/2017    2:04 PM 07/24/2015    4:04 PM  Advanced Directives  Does Patient Have a Medical Advance Directive? No No No Yes No No No  Type of Theme park manager;Living will     Copy of Healthcare Power of Attorney in Chart?    No - copy requested     Would patient like information on creating a medical advance directive? No - Patient declined  No - Patient declined  Yes (MAU/Ambulatory/Procedural Areas - Information given) Yes (MAU/Ambulatory/Procedural Areas - Information given) No - patient declined information    Current Medications (verified) Outpatient Encounter Medications as of 06/13/2023   Medication Sig   albuterol  (PROVENTIL  HFA;VENTOLIN  HFA) 108 (90 Base) MCG/ACT inhaler Inhale 2 puffs into the lungs every 6 (six) hours as needed for wheezing or shortness of breath.   Ascorbic Acid (VITAMIN C) 100 MG tablet Take 100 mg by mouth daily. When remembers   bisacodyl  (MAGIC BULLETS) 10 MG suppository Place 1 suppository (10 mg total) rectally daily as needed for mild constipation or moderate constipation.   cyclobenzaprine (FLEXERIL) 10 MG tablet Take 10 mg by mouth 3 (three) times daily as needed.   diclofenac (VOLTAREN) 75 MG EC tablet    EPINEPHrine  0.3 mg/0.3 mL IJ SOAJ injection Inject 0.3 mg into the muscle as needed for anaphylaxis.   Fluocinolone Acetonide Scalp 0.01 % OIL Apply topically daily.   fosfomycin (MONUROL) 3 g PACK    Ginger, Zingiber officinalis, (GINGER PO) Take by mouth as needed.   hyoscyamine  (ANASPAZ ) 0.125 MG TBDP disintergrating tablet Place 1 tablet (0.125 mg total) under the tongue every 4 (four) hours as needed.   ibuprofen (ADVIL) 600 MG tablet    ketoconazole  (NIZORAL ) 2 % shampoo Apply 1 Application topically 2 (two) times a week.   midodrine  (PROAMATINE ) 5 MG tablet TAKE 2 TABLETS BY MOUTH THREE TIMES DAILY AS NEEDED   Multiple Vitamin (MULTI-VITAMIN) tablet Take 1 tablet by mouth daily.   MYRBETRIQ 25 MG TB24 tablet Take by mouth.   norethindrone (MICRONOR) 0.35 MG tablet Take 1 tablet by mouth daily.   Omega-3 1000 MG CAPS Take by mouth.   ondansetron  (  ZOFRAN ) 4 MG tablet Take 1 tablet (4 mg total) by mouth every 8 (eight) hours as needed for nausea or vomiting.   oxybutynin (DITROPAN) 5 MG tablet Take by mouth.   Probiotic Product (MAGE) CPDR Take 1 capsule by mouth daily.   promethazine  (PHENERGAN ) 12.5 MG tablet Take 1 tablet (12.5 mg total) by mouth every 6 (six) hours as needed for nausea or vomiting.   saccharomyces boulardii (FLORASTOR) 250 MG capsule Take by mouth.   sodium chloride  0.9 % injection Inject into the vein.   sodium  chloride irrigation 0.9 % irrigation Irrigate with as directed.   traMADol (ULTRAM) 50 MG tablet Take by mouth.   HYDROcodone  bit-homatropine (HYCODAN) 5-1.5 MG/5ML syrup Take 5 mLs by mouth every 8 (eight) hours as needed for cough. (Patient not taking: Reported on 06/13/2023)   No facility-administered encounter medications on file as of 06/13/2023.    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   Family History  Problem Relation Age of Onset   Ovarian cysts Mother    Dysmenorrhea Mother    Diabetes Mother        type 2; having complications   Hypertension Mother    Bipolar disorder Brother    Schizophrenia Brother    Hypertension Other    Diabetes Other    Social History   Socioeconomic History   Marital status: Single    Spouse name: Not on file   Number of children: 1   Years of education: Not on file   Highest education level: Bachelor's degree (e.g., BA, AB, BS)  Occupational History   Occupation: disabled  Tobacco Use   Smoking status: Never   Smokeless tobacco: Never  Vaping Use   Vaping status: Never Used  Substance and Sexual Activity   Alcohol use: No    Alcohol/week: 0.0 standard drinks of alcohol   Drug use: No   Sexual activity: Not on file    Comment: sexually active at 33 years of age; has had one sexual partner. Used condoms  Other Topics Concern   Not on  file  Social History Narrative   Not on file   Social Drivers of Health   Financial Resource Strain: Low Risk  (06/13/2023)   Overall Financial Resource Strain (CARDIA)    Difficulty of Paying Living Expenses: Not hard at all  Food Insecurity: No Food Insecurity (06/13/2023)   Hunger Vital Sign    Worried About Running Out of Food in the Last Year: Never true    Ran Out of Food in the Last Year: Never true  Transportation Needs: No Transportation Needs (06/13/2023)   PRAPARE - Administrator, Civil Service (Medical): No    Lack of Transportation (Non-Medical): No  Physical Activity: Inactive (06/13/2023)   Exercise Vital Sign    Days of Exercise per Week: 0 days    Minutes of Exercise per Session: 0  min  Stress: No Stress Concern Present (06/13/2023)   [employer] of Occupational Health - Occupational Stress Questionnaire    Feeling of Stress : Not at all  Social Connections: Moderately Isolated (06/13/2023)   Social Connection and Isolation Panel [NHANES]    Frequency of Communication with Friends and Family: More than three times a week    Frequency of Social Gatherings with Friends and Family: Once a week    Attends Religious Services: More than 4 times per year    Active Member of [employer] or Organizations: No    Attends Engineer, structural: Never    Marital Status: Never married    Tobacco Counseling Counseling given: Not Answered    Clinical Intake:  Pre-visit preparation completed: Yes  Pain : No/denies pain     Nutritional Risks: None Diabetes: No  No results found for: "HGBA1C"   How often do you need to have someone help you when you read instructions, pamphlets, or other written materials from your doctor or pharmacy?: 1 - Never  Interpreter Needed?: No  Information entered by :: Dellie Fergusson, LPN   Activities of Daily Living    06/13/2023    4:04 PM 06/26/2022    2:46 PM  In your present state of health, do you have any difficulty  performing the following activities:  Hearing? 0 0  Vision? 0 1  Difficulty concentrating or making decisions? 0 0  Walking or climbing stairs? 1 1  Dressing or bathing? 1 1  Doing errands, shopping? 1 1  Preparing Food and eating ? N N  Using the Toilet? Y N  In the past six months, have you accidently leaked urine? N Y  Comment  s/p tabe  Do you have problems with loss of bowel control? N N  Managing your Medications? Colie Dawes  Comment  pick up or deliver  Managing your Finances? Colie Dawes  Housekeeping or managing your Housekeeping? Colie Dawes    Patient Care Team: Lamon Pillow, MD as PCP - General (Family Medicine) Zachary Hermanns, MD as Referring Physician (Urology) Dedrick Fanti, MD as Referring Physician (Physical Medicine and Rehabilitation)  Indicate any recent Medical Services you may have received from other than [facility] providers in the past year (date may be approximate).     Assessment:   This is a routine wellness examination for [place].  Hearing/Vision screen Hearing Screening - Comments:: NO AIDS Vision Screening - Comments:: NO GLASSES   Goals Addressed             This Visit's Progress    Cut out extra servings         Depression Screen     06/13/2023    4:02 PM 06/26/2022    2:39 PM 06/13/2021    1:07 PM 10/19/2020   10:48 AM 05/13/2019   10:24 AM 04/30/2017    4:22 PM 03/12/2017    2:05 PM  PHQ 2/9 Scores  PHQ - 2 Score 0 0 0 0 0 4 3  PHQ- 9 Score 0   3  17 13     Fall Risk     06/13/2023    4:04 PM 06/26/2022    2:36 PM 06/13/2021    1:09 PM 10/19/2020   10:48 AM 05/13/2019   10:27 AM  Fall Risk   Falls in the past year? 0 0 0 0 0  Number falls in past yr: 0 0 0 0 0  Injury with Fall? 0 0 0  0 0  Risk for fall due to : No Fall Risks No Fall Risks No Fall Risks No Fall Risks   Follow up Falls prevention discussed;Falls evaluation completed Education provided;Falls prevention discussed Falls evaluation completed Falls evaluation completed      MEDICARE RISK AT HOME:  Medicare Risk at Home Any stairs in or around the home?: No If so, are there any without handrails?: No Home free of loose throw rugs in walkways, pet beds, electrical cords, etc?: Yes Adequate lighting in your home to reduce risk of falls?: Yes Life alert?: No Use of a cane, walker or w/c?: Yes (W/C) Grab bars in the bathroom?: No Shower chair or bench in shower?: No Elevated toilet seat or a handicapped toilet?: No  TIMED UP AND GO:  Was the test performed?  No  Cognitive Function: 6CIT completed        06/13/2023    4:06 PM 06/26/2022    2:49 PM 05/13/2019   10:35 AM  6CIT Screen  What Year? 0 points 0 points 0 points  What month? 0 points 0 points 0 points  What time? 0 points 0 points 0 points  Count back from 20 0 points 0 points 0 points  Months in reverse 0 points 4 points 0 points  Repeat phrase 0 points 0 points 0 points  Total Score 0 points 4 points 0 points    Immunizations Immunization History  Administered Date(s) Administered   HPV Quadrivalent 10/20/2009, 01/18/2010, 05/25/2010   Hepatitis A 10/20/2009, 05/25/2010   Meningococcal Conjugate 10/20/2009   Tdap 10/20/2009    Screening Tests Health Maintenance  Topic Date Due   COVID-19 Vaccine (1) Never done   HIV Screening  Never done   Pneumococcal Vaccine 36-2 Years old (1 of 2 - PCV) Never done   DTaP/Tdap/Td (2 - Td or Tdap) 10/21/2019   INFLUENZA VACCINE  09/07/2023   Cervical Cancer Screening (HPV/Pap Cotest)  04/14/2024   Medicare Annual Wellness (AWV)  06/12/2024   HPV VACCINES  Completed   Hepatitis C Screening  Completed   Meningococcal B Vaccine  Aged Out    Health Maintenance  Health Maintenance Due  Topic Date Due   COVID-19 Vaccine (1) Never done   HIV Screening  Never done   Pneumococcal Vaccine 60-42 Years old (1 of 2 - PCV) Never done   DTaP/Tdap/Td (2 - Td or Tdap) 10/21/2019   Health Maintenance Items Addressed: UP TO DATE  Additional  Screening:  Vision Screening: Recommended annual ophthalmology exams for early detection of glaucoma and other disorders of the eye.  Dental Screening: Recommended annual dental exams for proper oral hygiene  Community Resource Referral / Chronic Care Management: CRR required this visit?  No   CCM required this visit?  No     Plan:     I have personally reviewed and noted the following in the patient's chart:   Medical and social history Use of alcohol, tobacco or illicit drugs  Current medications and supplements including opioid prescriptions. Patient is not currently taking opioid prescriptions. Functional ability and status Nutritional status Physical activity Advanced directives List of other physicians Hospitalizations, surgeries, and ER visits in previous 12 months Vitals Screenings to include cognitive, depression, and falls Referrals and appointments  In addition, I have reviewed and discussed with patient certain preventive protocols, quality metrics, and best practice recommendations. A written personalized care plan for preventive services as well as general preventive health recommendations were provided to patient.  Pinky Bright, LPN   02/11/1094   After Visit Summary: (MyChart) Due to this being a telephonic visit, the after visit summary with patients personalized plan was offered to patient via MyChart   Notes: Nothing significant to report at this time.

## 2023-06-13 NOTE — Patient Instructions (Addendum)
 Cassandra Lowe , Thank you for taking time to come for your Medicare Wellness Visit. I appreciate your ongoing commitment to your health goals. Please review the following plan we discussed and let me know if I can assist you in the future.   Referrals/Orders/Follow-Ups/Clinician Recommendations: NONE  This is a list of the screening recommended for you and due dates:  Health Maintenance  Topic Date Due   COVID-19 Vaccine (1) Never done   HIV Screening  Never done   Pneumococcal Vaccination (1 of 2 - PCV) Never done   DTaP/Tdap/Td vaccine (2 - Td or Tdap) 10/21/2019   Flu Shot  09/07/2023   Pap with HPV screening  04/14/2024   Medicare Annual Wellness Visit  06/12/2024   HPV Vaccine  Completed   Hepatitis C Screening  Completed   Meningitis B Vaccine  Aged Out    Advanced directives: (ACP Link)Information on Advanced Care Planning can be found at [place]  Secretary of [facility] Advance Health Care Directives Advance Health Care Directives. [email or web address]   Next Medicare Annual Wellness Visit scheduled for next year: Yes  06/24/24 @ 2:30 PM BY PHONE  Have you seen your provider in the last 6 months (3 months if uncontrolled diabetes)? Yes

## 2023-06-18 DIAGNOSIS — Z8489 Family history of other specified conditions: Secondary | ICD-10-CM | POA: Diagnosis not present

## 2023-06-18 DIAGNOSIS — G8250 Quadriplegia, unspecified: Secondary | ICD-10-CM | POA: Diagnosis not present

## 2023-06-20 DIAGNOSIS — R339 Retention of urine, unspecified: Secondary | ICD-10-CM | POA: Diagnosis not present

## 2023-06-21 DIAGNOSIS — R339 Retention of urine, unspecified: Secondary | ICD-10-CM | POA: Diagnosis not present

## 2023-07-11 DIAGNOSIS — G8250 Quadriplegia, unspecified: Secondary | ICD-10-CM | POA: Diagnosis not present

## 2023-07-16 DIAGNOSIS — Z8489 Family history of other specified conditions: Secondary | ICD-10-CM | POA: Diagnosis not present

## 2023-07-16 DIAGNOSIS — Z681 Body mass index (BMI) 19 or less, adult: Secondary | ICD-10-CM | POA: Diagnosis not present

## 2023-07-16 DIAGNOSIS — G8250 Quadriplegia, unspecified: Secondary | ICD-10-CM | POA: Diagnosis not present

## 2023-07-19 DIAGNOSIS — N319 Neuromuscular dysfunction of bladder, unspecified: Secondary | ICD-10-CM | POA: Diagnosis not present

## 2023-07-19 DIAGNOSIS — Z6740 Type O blood, Rh positive: Secondary | ICD-10-CM | POA: Diagnosis not present

## 2023-07-19 DIAGNOSIS — G8250 Quadriplegia, unspecified: Secondary | ICD-10-CM | POA: Diagnosis not present

## 2023-07-19 DIAGNOSIS — R030 Elevated blood-pressure reading, without diagnosis of hypertension: Secondary | ICD-10-CM | POA: Diagnosis not present

## 2023-07-19 DIAGNOSIS — Z79899 Other long term (current) drug therapy: Secondary | ICD-10-CM | POA: Diagnosis not present

## 2023-07-19 DIAGNOSIS — Z87440 Personal history of urinary (tract) infections: Secondary | ICD-10-CM | POA: Diagnosis not present

## 2023-07-19 DIAGNOSIS — S14104S Unspecified injury at C4 level of cervical spinal cord, sequela: Secondary | ICD-10-CM | POA: Diagnosis not present

## 2023-07-19 DIAGNOSIS — D509 Iron deficiency anemia, unspecified: Secondary | ICD-10-CM | POA: Diagnosis not present

## 2023-07-20 DIAGNOSIS — R001 Bradycardia, unspecified: Secondary | ICD-10-CM | POA: Diagnosis not present

## 2023-07-20 DIAGNOSIS — R55 Syncope and collapse: Secondary | ICD-10-CM | POA: Diagnosis not present

## 2023-07-23 DIAGNOSIS — R339 Retention of urine, unspecified: Secondary | ICD-10-CM | POA: Diagnosis not present

## 2023-07-26 DIAGNOSIS — D509 Iron deficiency anemia, unspecified: Secondary | ICD-10-CM | POA: Diagnosis not present

## 2023-07-29 ENCOUNTER — Ambulatory Visit: Payer: Self-pay | Admitting: Family Medicine

## 2023-08-05 ENCOUNTER — Encounter (HOSPITAL_COMMUNITY): Payer: Self-pay

## 2023-08-05 ENCOUNTER — Observation Stay (HOSPITAL_COMMUNITY)
Admission: AD | Admit: 2023-08-05 | Discharge: 2023-08-07 | Disposition: A | Discharge status: Home / Self Care | Attending: Obstetrics and Gynecology | Admitting: Obstetrics and Gynecology

## 2023-08-05 DIAGNOSIS — D72829 Elevated white blood cell count, unspecified: Secondary | ICD-10-CM | POA: Diagnosis not present

## 2023-08-05 DIAGNOSIS — K769 Liver disease, unspecified: Secondary | ICD-10-CM | POA: Diagnosis not present

## 2023-08-05 DIAGNOSIS — O2653 Maternal hypotension syndrome, third trimester: Secondary | ICD-10-CM | POA: Insufficient documentation

## 2023-08-05 DIAGNOSIS — O26893 Other specified pregnancy related conditions, third trimester: Secondary | ICD-10-CM | POA: Diagnosis not present

## 2023-08-05 DIAGNOSIS — O99013 Anemia complicating pregnancy, third trimester: Secondary | ICD-10-CM | POA: Insufficient documentation

## 2023-08-05 DIAGNOSIS — R110 Nausea: Secondary | ICD-10-CM | POA: Diagnosis not present

## 2023-08-05 DIAGNOSIS — O99283 Endocrine, nutritional and metabolic diseases complicating pregnancy, third trimester: Secondary | ICD-10-CM | POA: Diagnosis not present

## 2023-08-05 DIAGNOSIS — Z91040 Latex allergy status: Secondary | ICD-10-CM | POA: Insufficient documentation

## 2023-08-05 DIAGNOSIS — Z3A31 31 weeks gestation of pregnancy: Secondary | ICD-10-CM | POA: Insufficient documentation

## 2023-08-05 DIAGNOSIS — R932 Abnormal findings on diagnostic imaging of liver and biliary tract: Secondary | ICD-10-CM | POA: Diagnosis not present

## 2023-08-05 DIAGNOSIS — O365930 Maternal care for other known or suspected poor fetal growth, third trimester, not applicable or unspecified: Secondary | ICD-10-CM | POA: Diagnosis not present

## 2023-08-05 DIAGNOSIS — R29898 Other symptoms and signs involving the musculoskeletal system: Secondary | ICD-10-CM | POA: Diagnosis not present

## 2023-08-05 DIAGNOSIS — E876 Hypokalemia: Secondary | ICD-10-CM | POA: Diagnosis not present

## 2023-08-05 DIAGNOSIS — O99113 Other diseases of the blood and blood-forming organs and certain disorders involving the immune mechanism complicating pregnancy, third trimester: Secondary | ICD-10-CM | POA: Insufficient documentation

## 2023-08-05 DIAGNOSIS — I959 Hypotension, unspecified: Secondary | ICD-10-CM | POA: Diagnosis not present

## 2023-08-05 DIAGNOSIS — R1110 Vomiting, unspecified: Secondary | ICD-10-CM | POA: Diagnosis not present

## 2023-08-05 DIAGNOSIS — R1010 Upper abdominal pain, unspecified: Secondary | ICD-10-CM | POA: Diagnosis not present

## 2023-08-05 DIAGNOSIS — R1111 Vomiting without nausea: Secondary | ICD-10-CM | POA: Diagnosis not present

## 2023-08-05 DIAGNOSIS — O219 Vomiting of pregnancy, unspecified: Secondary | ICD-10-CM | POA: Diagnosis present

## 2023-08-05 DIAGNOSIS — R140 Abdominal distension (gaseous): Secondary | ICD-10-CM | POA: Diagnosis not present

## 2023-08-05 DIAGNOSIS — K920 Hematemesis: Principal | ICD-10-CM

## 2023-08-05 DIAGNOSIS — O218 Other vomiting complicating pregnancy: Principal | ICD-10-CM | POA: Insufficient documentation

## 2023-08-05 DIAGNOSIS — R109 Unspecified abdominal pain: Secondary | ICD-10-CM | POA: Diagnosis not present

## 2023-08-05 LAB — CBC
HCT: 30.1 % — ABNORMAL LOW (ref 36.0–46.0)
Hemoglobin: 9.8 g/dL — ABNORMAL LOW (ref 12.0–15.0)
MCH: 27.8 pg (ref 26.0–34.0)
MCHC: 32.6 g/dL (ref 30.0–36.0)
MCV: 85.3 fL (ref 80.0–100.0)
Platelets: 284 10*3/uL (ref 150–400)
RBC: 3.53 MIL/uL — ABNORMAL LOW (ref 3.87–5.11)
RDW: 14.7 % (ref 11.5–15.5)
WBC: 15.7 10*3/uL — ABNORMAL HIGH (ref 4.0–10.5)
nRBC: 0 % (ref 0.0–0.2)

## 2023-08-05 LAB — COMPREHENSIVE METABOLIC PANEL WITH GFR
ALT: 36 U/L (ref 0–44)
AST: 36 U/L (ref 15–41)
Albumin: 2.5 g/dL — ABNORMAL LOW (ref 3.5–5.0)
Alkaline Phosphatase: 80 U/L (ref 38–126)
Anion gap: 16 — ABNORMAL HIGH (ref 5–15)
BUN: 9 mg/dL (ref 6–20)
CO2: 17 mmol/L — ABNORMAL LOW (ref 22–32)
Calcium: 9.1 mg/dL (ref 8.9–10.3)
Chloride: 102 mmol/L (ref 98–111)
Creatinine, Ser: 0.73 mg/dL (ref 0.44–1.00)
GFR, Estimated: >60 mL/min (ref >60)
Glucose, Bld: 90 mg/dL (ref 70–99)
Potassium: 3.1 mmol/L — ABNORMAL LOW (ref 3.5–5.1)
Sodium: 135 mmol/L (ref 135–145)
Total Bilirubin: 0.9 mg/dL (ref 0.0–1.2)
Total Protein: 6.7 g/dL (ref 6.5–8.1)

## 2023-08-05 LAB — LIPASE, BLOOD: Lipase: 31 U/L (ref 11–51)

## 2023-08-05 LAB — AMYLASE: Amylase: 76 U/L (ref 28–100)

## 2023-08-05 MED ORDER — LACTATED RINGERS IV BOLUS: 1000.0000 mL | Freq: Once | INTRAVENOUS | Status: DC

## 2023-08-05 MED ORDER — ONDANSETRON HCL 4 MG/2ML IJ SOLN
4.0000 mg | Freq: Once | INTRAMUSCULAR | Status: DC
  Administered 2023-08-05: 4 mg via INTRAVENOUS
  Filled 2023-08-05: qty 2

## 2023-08-05 MED ORDER — LACTATED RINGERS IV SOLN: INTRAVENOUS | Status: DC

## 2023-08-05 MED ORDER — FAMOTIDINE IN NACL 20-0.9 MG/50ML-% IV SOLN
20.0000 mg | Freq: Once | INTRAVENOUS | Status: AC
  Administered 2023-08-05: 20 mg via INTRAVENOUS
  Filled 2023-08-05: qty 50

## 2023-08-05 MED ORDER — PROCHLORPERAZINE EDISYLATE 10 MG/2ML IJ SOLN
10.0000 mg | Freq: Once | INTRAMUSCULAR | Status: DC
  Filled 2023-08-05: qty 2

## 2023-08-05 MED ORDER — PROCHLORPERAZINE EDISYLATE 10 MG/2ML IJ SOLN
10.0000 mg | Freq: Once | INTRAMUSCULAR | Status: AC
  Administered 2023-08-05: 10 mg via INTRAVENOUS
  Filled 2023-08-05: qty 2

## 2023-08-05 MED ORDER — PHENOL 1.4 % MT LIQD
1.0000 | OROMUCOSAL | Status: DC | PRN
  Administered 2023-08-05: 1 via OROMUCOSAL
  Filled 2023-08-05: qty 177

## 2023-08-05 MED ORDER — MIDODRINE HCL 5 MG PO TABS
5.0000 mg | ORAL_TABLET | Freq: Once | ORAL | Status: AC
Start: 2023-08-05 — End: 2023-08-05
  Administered 2023-08-05: 5 mg via ORAL
  Filled 2023-08-05: qty 1

## 2023-08-05 NOTE — MAU Provider Note (Incomplete)
 History     CSN: [id number]  Arrival date and time: 08/05/23 2022   Event Date/Time   First Provider Initiated Contact with Patient 08/05/23 2106      Chief Complaint  Patient presents with  . Nausea  . Emesis   HPI  {GYN/OB [id number]}  Past Medical History:  Diagnosis Date  . Burst fracture of cervical vertebra (HCC)   . History of chicken pox   . Quadriplegia [facility])     Past Surgical History:  Procedure Laterality Date  . Bladder botox    . C4-C6 cervical fusion with Decompression or C5 burst fracture  02/12/2014  . MOUTH SURGERY     one tooth removed  . Reversal of Tracheostomy  03/22/2014  . TRACHEOSTOMY  02/26/2014   pallative tracheostomy for respiratory failure secondary to C-spine fracture and paraplegia    Family History  Problem Relation Age of Onset  . Ovarian cysts Mother   . Dysmenorrhea Mother   . Diabetes Mother        type 2; having complications  . Hypertension Mother   . Bipolar disorder Brother   . Schizophrenia Brother   . Hypertension Other   . Diabetes Other     Social History   Tobacco Use  . Smoking status: Never  . Smokeless tobacco: Never  Vaping Use  . Vaping status: Never Used  Substance Use Topics  . Alcohol use: No    Alcohol/week: 0.0 standard drinks of alcohol  . Drug use: No    Allergies:  Allergies  Allergen Reactions  . 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 Sulfamethoxazole -Trimethoprim  Hives and Other (See Comments)    Mouth blisters  Lip itching  Elspeth Louder Syndrome  . Nitrofurantoin Diarrhea  . Baclofen     Memory loss  . Morphine And Codeine Nausea Only  . Oxycodone Itching  . Tape     Other reaction(s): Other (See Comments) Skin burns Skin breakdown  . Latex     Other reaction(s): Other (See Comments) Skin burns Skin breakdown Other reaction(s): Other (See Comments) blisters  . Tramadol Itching    Patient can take if she  takes a benadryl  with it.     Medications Prior to Admission  Medication Sig Dispense Refill Last Dose/Taking  . Ascorbic Acid (VITAMIN C) 100 MG tablet Take 100 mg by mouth daily. When remembers   Past Week  . bisacodyl  (MAGIC BULLETS) 10 MG suppository Place 1 suppository (10 mg total) rectally daily as needed for mild constipation or moderate constipation. 12 suppository 4 Past Week  . midodrine  (PROAMATINE ) 5 MG tablet TAKE 2 TABLETS BY MOUTH THREE TIMES DAILY AS NEEDED 540 tablet 1 08/05/2023 Noon  . oxybutynin (DITROPAN) 5 MG tablet Take by mouth.   Past Week  . albuterol  (PROVENTIL  HFA;VENTOLIN  HFA) 108 (90 Base) MCG/ACT inhaler Inhale 2 puffs into the lungs every 6 (six) hours as needed for wheezing or shortness of breath. 1 Inhaler 1   . cyclobenzaprine (FLEXERIL) 10 MG tablet Take 10 mg by mouth 3 (three) times daily as needed.   More than a month  . diclofenac (VOLTAREN) 75 MG EC tablet    More than a month  . EPINEPHrine  0.3 mg/0.3 mL IJ SOAJ injection Inject 0.3 mg into the muscle as needed for anaphylaxis. 2 each 3   . Fluocinolone Acetonide Scalp 0.01 % OIL Apply topically daily.     . fosfomycin (MONUROL) 3  g PACK      . Ginger, Zingiber officinalis, (GINGER PO) Take by mouth as needed.     . HYDROcodone  bit-homatropine (HYCODAN) 5-1.5 MG/5ML syrup Take 5 mLs by mouth every 8 (eight) hours as needed for cough. (Patient not taking: Reported on 06/13/2023) 120 mL 0 More than a month  . hyoscyamine  (ANASPAZ ) 0.125 MG TBDP disintergrating tablet Place 1 tablet (0.125 mg total) under the tongue every 4 (four) hours as needed. 10 tablet 0   . ibuprofen (ADVIL) 600 MG tablet      . ketoconazole  (NIZORAL ) 2 % shampoo Apply 1 Application topically 2 (two) times a week. 120 mL 1   . Multiple Vitamin (MULTI-VITAMIN) tablet Take 1 tablet by mouth daily.     SABRA MYRBETRIQ 25 MG TB24 tablet Take by mouth.     . norethindrone (MICRONOR) 0.35 MG tablet Take 1 tablet by mouth daily.     . Omega-3  1000 MG CAPS Take by mouth.     . ondansetron  (ZOFRAN ) 4 MG tablet Take 1 tablet (4 mg total) by mouth every 8 (eight) hours as needed for nausea or vomiting. 20 tablet 1   . Probiotic Product (MAGE) CPDR Take 1 capsule by mouth daily.     . promethazine  (PHENERGAN ) 12.5 MG tablet Take 1 tablet (12.5 mg total) by mouth every 6 (six) hours as needed for nausea or vomiting. 10 tablet 4 More than a month  . saccharomyces boulardii (FLORASTOR) 250 MG capsule Take by mouth.     . sodium chloride  0.9 % injection Inject into the vein.     . sodium chloride  irrigation 0.9 % irrigation Irrigate with as directed.     . traMADol (ULTRAM) 50 MG tablet Take by mouth.       Review of Systems Physical Exam   Blood pressure (!) 72/34, pulse 87, temperature 99.4 F (37.4 C), temperature source Oral, resp. rate 16, SpO2 100%.  Physical Exam  MAU Course  Procedures  MDM ***  Assessment and Plan  ***  Cassandra Lowe 08/05/2023, 9:06 PM

## 2023-08-05 NOTE — Progress Notes (Signed)
 Spoke with cardiology. Recommends continued fluid bolus and treat patient's N/V to try to administer midodrine  as prescribed. Reviewed if unable to tolerate midodrine  and BP remains low, only other options are pressors through the IV. If we are unable to do help her tolerate her routine medications, then we will need to consult ICU for possible pressor support. Based on the short duration of symptoms (started today), I suspect we will be able to improve her symptoms.   I personally reviewed outside records and notes in [facility], including triage note and all notes available in June.   Vina Solian, MD Attending Obstetrician & Gynecologist, [facility], [facility]

## 2023-08-05 NOTE — MAU Note (Signed)
 MAU Triage Note:  .Cassandra Lowe is a 33 y.o. here in MAU reporting: arrived via EMS with complaints of nausea and vomiting. Per EMS, patient had 4 episodes of coffee ground emesis. EMS also reports hypotension with BP 70/45 - given IVF bolus. Reports relief with zofran  given by EMS. Patient also reports darker urine from catheter today. Denies VB or LOF.   Patient complaint: ems vomiting      Patient denies pain  Onset of complaint: today  Vitals:   08/05/23 2028  BP: 123/63  Pulse: 95  Resp: 16  Temp: 99.4 F (37.4 C)  SpO2: 100%    FHT:   145  Lab orders placed from triage: UA

## 2023-08-06 ENCOUNTER — Inpatient Hospital Stay (HOSPITAL_COMMUNITY)

## 2023-08-06 ENCOUNTER — Encounter (HOSPITAL_COMMUNITY): Payer: Self-pay | Admitting: Obstetrics and Gynecology

## 2023-08-06 DIAGNOSIS — N319 Neuromuscular dysfunction of bladder, unspecified: Secondary | ICD-10-CM

## 2023-08-06 DIAGNOSIS — R140 Abdominal distension (gaseous): Secondary | ICD-10-CM | POA: Diagnosis not present

## 2023-08-06 DIAGNOSIS — R932 Abnormal findings on diagnostic imaging of liver and biliary tract: Secondary | ICD-10-CM

## 2023-08-06 DIAGNOSIS — D509 Iron deficiency anemia, unspecified: Secondary | ICD-10-CM | POA: Diagnosis not present

## 2023-08-06 DIAGNOSIS — R112 Nausea with vomiting, unspecified: Secondary | ICD-10-CM

## 2023-08-06 DIAGNOSIS — K6389 Other specified diseases of intestine: Secondary | ICD-10-CM | POA: Diagnosis not present

## 2023-08-06 DIAGNOSIS — E876 Hypokalemia: Secondary | ICD-10-CM | POA: Diagnosis not present

## 2023-08-06 DIAGNOSIS — K769 Liver disease, unspecified: Secondary | ICD-10-CM | POA: Diagnosis not present

## 2023-08-06 DIAGNOSIS — O218 Other vomiting complicating pregnancy: Secondary | ICD-10-CM | POA: Diagnosis not present

## 2023-08-06 DIAGNOSIS — R109 Unspecified abdominal pain: Secondary | ICD-10-CM | POA: Diagnosis not present

## 2023-08-06 DIAGNOSIS — O219 Vomiting of pregnancy, unspecified: Secondary | ICD-10-CM | POA: Diagnosis present

## 2023-08-06 DIAGNOSIS — R1110 Vomiting, unspecified: Secondary | ICD-10-CM | POA: Diagnosis not present

## 2023-08-06 DIAGNOSIS — K920 Hematemesis: Principal | ICD-10-CM

## 2023-08-06 LAB — CBC WITH DIFFERENTIAL/PLATELET
Abs Immature Granulocytes: 0.38 10*3/uL — ABNORMAL HIGH (ref 0.00–0.07)
Basophils Absolute: 0.1 10*3/uL (ref 0.0–0.1)
Basophils Relative: 0 %
Eosinophils Absolute: 0 10*3/uL (ref 0.0–0.5)
Eosinophils Relative: 0 %
HCT: 27.6 % — ABNORMAL LOW (ref 36.0–46.0)
Hemoglobin: 8.9 g/dL — ABNORMAL LOW (ref 12.0–15.0)
Immature Granulocytes: 3 %
Lymphocytes Relative: 14 %
Lymphs Abs: 1.8 10*3/uL (ref 0.7–4.0)
MCH: 27.7 pg (ref 26.0–34.0)
MCHC: 32.2 g/dL (ref 30.0–36.0)
MCV: 86 fL (ref 80.0–100.0)
Monocytes Absolute: 1.2 10*3/uL — ABNORMAL HIGH (ref 0.1–1.0)
Monocytes Relative: 9 %
Neutro Abs: 9.9 10*3/uL — ABNORMAL HIGH (ref 1.7–7.7)
Neutrophils Relative %: 74 %
Platelets: 267 10*3/uL (ref 150–400)
RBC: 3.21 MIL/uL — ABNORMAL LOW (ref 3.87–5.11)
RDW: 14.9 % (ref 11.5–15.5)
WBC: 13.4 10*3/uL — ABNORMAL HIGH (ref 4.0–10.5)
nRBC: 0 % (ref 0.0–0.2)

## 2023-08-06 LAB — TYPE AND SCREEN
ABO/RH(D): O POS
Antibody Screen: NEGATIVE

## 2023-08-06 MED ORDER — SCOPOLAMINE 1 MG/3DAYS TD PT72: 1.0000 | MEDICATED_PATCH | TRANSDERMAL | Status: DC

## 2023-08-06 MED ORDER — FAMOTIDINE IN NACL 20-0.9 MG/50ML-% IV SOLN
20.0000 mg | Freq: Two times a day (BID) | INTRAVENOUS | Status: DC
  Administered 2023-08-06 – 2023-08-07 (×3): 20 mg via INTRAVENOUS
  Filled 2023-08-06 (×3): qty 50

## 2023-08-06 MED ORDER — IRON SUCROSE 200 MG IVPB - SIMPLE MED
200.0000 mg | Freq: Once | Status: AC
  Administered 2023-08-06: 200 mg via INTRAVENOUS
  Filled 2023-08-06: qty 200

## 2023-08-06 MED ORDER — MIDODRINE HCL 5 MG PO TABS
5.0000 mg | ORAL_TABLET | Freq: Three times a day (TID) | ORAL | Status: DC
  Administered 2023-08-06 – 2023-08-07 (×5): 5 mg via ORAL
  Filled 2023-08-06 (×7): qty 1

## 2023-08-06 MED ORDER — SODIUM CHLORIDE 0.9 % IV SOLN: INTRAVENOUS | Status: DC

## 2023-08-06 MED ORDER — SODIUM CHLORIDE 0.9 % IV SOLN: 8.0000 mg | Freq: Four times a day (QID) | INTRAVENOUS | Status: DC | PRN

## 2023-08-06 MED ORDER — SODIUM CHLORIDE 0.9% FLUSH: 3.0000 mL | Freq: Two times a day (BID) | INTRAVENOUS | Status: DC

## 2023-08-06 MED ORDER — POTASSIUM CHLORIDE 10 MEQ/100ML IV SOLN
10.0000 meq | INTRAVENOUS | Status: AC
  Administered 2023-08-06 (×5): 10 meq via INTRAVENOUS
  Filled 2023-08-06 (×5): qty 100

## 2023-08-06 MED ORDER — POTASSIUM CHLORIDE 10 MEQ/100ML IV SOLN
10.0000 meq | INTRAVENOUS | Status: DC
  Administered 2023-08-06: 10 meq via INTRAVENOUS
  Filled 2023-08-06: qty 100

## 2023-08-06 MED ORDER — ONDANSETRON HCL 4 MG/2ML IJ SOLN
4.0000 mg | Freq: Four times a day (QID) | INTRAMUSCULAR | Status: DC | PRN
  Filled 2023-08-06: qty 2

## 2023-08-06 MED ORDER — ACETAMINOPHEN 325 MG PO TABS: 650.0000 mg | ORAL_TABLET | ORAL | Status: DC | PRN

## 2023-08-06 MED ORDER — PRENATAL MULTIVITAMIN CH
1.0000 | ORAL_TABLET | Freq: Every day | ORAL | Status: DC
  Filled 2023-08-06: qty 1

## 2023-08-06 MED ORDER — METOCLOPRAMIDE HCL 5 MG/ML IJ SOLN
10.0000 mg | Freq: Four times a day (QID) | INTRAMUSCULAR | Status: DC
  Administered 2023-08-06 (×2): 10 mg via INTRAVENOUS
  Filled 2023-08-06 (×2): qty 2

## 2023-08-06 MED ORDER — MENTHOL 3 MG MT LOZG
1.0000 | LOZENGE | OROMUCOSAL | Status: DC | PRN
  Administered 2023-08-06: 3 mg via ORAL
  Filled 2023-08-06: qty 9

## 2023-08-06 MED ORDER — ACETAMINOPHEN 160 MG/5ML PO SOLN
650.0000 mg | ORAL | Status: DC | PRN
  Administered 2023-08-06 – 2023-08-07 (×5): 650 mg via ORAL
  Filled 2023-08-06 (×5): qty 20.3

## 2023-08-06 MED ORDER — FAMOTIDINE IN NACL 20-0.9 MG/50ML-% IV SOLN: 20.0000 mg | Freq: Once | INTRAVENOUS | Status: DC

## 2023-08-06 MED ORDER — LACTATED RINGERS IV SOLN: INTRAVENOUS | Status: DC

## 2023-08-06 MED ORDER — METOCLOPRAMIDE HCL 5 MG/ML IJ SOLN: 10.0000 mg | Freq: Four times a day (QID) | INTRAMUSCULAR | Status: DC

## 2023-08-06 MED ORDER — SODIUM CHLORIDE 0.9% FLUSH: 3.0000 mL | INTRAVENOUS | Status: DC | PRN

## 2023-08-06 MED ORDER — SUCRALFATE 1 GM/10ML PO SUSP
1.0000 g | Freq: Three times a day (TID) | ORAL | Status: DC
  Administered 2023-08-06 – 2023-08-07 (×3): 1 g via ORAL
  Filled 2023-08-06 (×5): qty 10

## 2023-08-06 NOTE — Consult Note (Signed)
 Consultation  Referring Provider: OB/ Cleatus Primary Care Physician:  Gasper Nancyann BRAVO, MD Primary Gastroenterologist:  unassigned  Reason for Consultation: Intractable  Nausea/ Vomiting in 31 week pregnancy  HPI: Cassandra Lowe is a 33 y.o. female with history of C-spine cord injury with secondary quadriplegia, neurogenic bladder with urostomy.  Patient is currently 31 weeks and 5 days pregnant with her second child, has been followed by high risk OB/GYN at [facility].  Patient also has history of chronic hypotension requiring midodrine , and chronic anemia, iron deficient for which she has been receiving IV iron infusions via [facility]. She says she started feeling sick about 3 days ago with nausea, then vomiting which became intractable.  She was unable to keep down any p.o.'s including her medications.  No associated headache, fever or significant abdominal cramping, she did have some loose stools.  Yesterday her husband became concerned because she was more lethargic, had a low blood pressure at home which they recorded as 60/40 and she had vomited a couple of times fairly large volume with brown material.  (They did bring a specimen of this which is clear brown, no obvious coffee-ground material).  She did not have any melena at home.  She does complain of a sore throat from acid reflux after vomiting. She was brought to the emergency room last night, has been receiving IV fluids since and feels a lot better. Plain abdominal film negative for obstruction Abdominal ultrasound showed no evidence of gallstones or gallbladder wall thickening she does have a 2.2 x 2.8 x 2.8 cm hyperechoic lesion in the right lobe of the liver. (Patient says this is a longstanding benign finding)  Labs on admission with WBC 15.7, hemoglobin 9.8/hematocrit 30.1/MCV 85.3 Potassium 3.1/BUN 9/creatinine 0.73 Albumin 2.5 LFTs within normal limits Lipase 31  Today WBC 13.4/hemoglobin 8.9/hematocrit  27.6  Reviewing outpatient labs her hemoglobin on 07/19/2023 was 9.1/hematocrit 26.6. Patient says she is scheduled for 1 more IV Venofer infusion tomorrow.  She is feeling better today with IV fluid hydration would like to try some clear liquids    Past Medical History:  Diagnosis Date   Burst fracture of cervical vertebra (HCC)    History of chicken pox    Quadriplegia (HCC)     Past Surgical History:  Procedure Laterality Date   Bladder botox     C4-C6 cervical fusion with Decompression or C5 burst fracture  02/12/2014   MOUTH SURGERY     one tooth removed   Reversal of Tracheostomy  03/22/2014   TRACHEOSTOMY  02/26/2014   pallative tracheostomy for respiratory failure secondary to C-spine fracture and paraplegia    Prior to Admission medications   Medication Sig Start Date End Date Taking? Authorizing Provider  Ascorbic Acid (VITAMIN C) 100 MG tablet Take 100 mg by mouth daily. When remembers   Yes [provider]  bisacodyl  (MAGIC BULLETS) 10 MG suppository Place 1 suppository (10 mg total) rectally daily as needed for mild constipation or moderate constipation. 05/23/17  Yes Gasper Nancyann BRAVO, MD  midodrine  (PROAMATINE ) 5 MG tablet TAKE 2 TABLETS BY MOUTH THREE TIMES DAILY AS NEEDED 11/06/22  Yes Gasper Nancyann BRAVO, MD  oxybutynin (DITROPAN) 5 MG tablet Take by mouth. 11/01/20  Yes [provider]  albuterol  (PROVENTIL  HFA;VENTOLIN  HFA) 108 (90 Base) MCG/ACT inhaler Inhale 2 puffs into the lungs every 6 (six) hours as needed for wheezing or shortness of breath. 06/25/17   Gasper Nancyann BRAVO, MD  cyclobenzaprine (FLEXERIL) 10 MG  tablet Take 10 mg by mouth 3 (three) times daily as needed. 04/10/19   [provider]  diclofenac (VOLTAREN) 75 MG EC tablet  04/10/19   [provider]  EPINEPHrine  0.3 mg/0.3 mL IJ SOAJ injection Inject 0.3 mg into the muscle as needed for anaphylaxis. 07/20/20   Gasper Nancyann BRAVO, MD  Fluocinolone Acetonide Scalp 0.01 % OIL  Apply topically daily. 03/04/21   [provider]  fosfomycin (MONUROL) 3 g PACK  04/12/21   [provider]  Ginger, Zingiber officinalis, (GINGER PO) Take by mouth as needed.    [provider]  HYDROcodone  bit-homatropine (HYCODAN) 5-1.5 MG/5ML syrup Take 5 mLs by mouth every 8 (eight) hours as needed for cough. Patient not taking: Reported on 06/13/2023 01/09/22   Gasper Nancyann BRAVO, MD  hyoscyamine  (ANASPAZ ) 0.125 MG TBDP disintergrating tablet Place 1 tablet (0.125 mg total) under the tongue every 4 (four) hours as needed. 07/24/22   Gasper Nancyann BRAVO, MD  ibuprofen (ADVIL) 600 MG tablet  08/25/20   [provider]  ketoconazole  (NIZORAL ) 2 % shampoo Apply 1 Application topically 2 (two) times a week. 09/15/21   Gasper Nancyann BRAVO, MD  Multiple Vitamin (MULTI-VITAMIN) tablet Take 1 tablet by mouth daily.    [provider]  MYRBETRIQ 25 MG TB24 tablet Take by mouth. 10/14/20   [provider]  Omega-3 1000 MG CAPS Take by mouth.    [provider]  ondansetron  (ZOFRAN ) 4 MG tablet Take 1 tablet (4 mg total) by mouth every 8 (eight) hours as needed for nausea or vomiting. 04/07/21   Gasper Nancyann BRAVO, MD  Probiotic Product (MAGE) CPDR Take 1 capsule by mouth daily.    [provider]  promethazine  (PHENERGAN ) 12.5 MG tablet Take 1 tablet (12.5 mg total) by mouth every 6 (six) hours as needed for nausea or vomiting. 02/14/23   Gasper Nancyann BRAVO, MD  saccharomyces boulardii (FLORASTOR) 250 MG capsule Take by mouth.    [provider]  sodium chloride  0.9 % injection Inject into the vein. 04/08/20   [provider]  sodium chloride  irrigation 0.9 % irrigation Irrigate with as directed. 04/08/20   [provider]  traMADol (ULTRAM) 50 MG tablet Take by mouth. 01/08/20   [provider]    Current Facility-Administered Medications  Medication Dose Route Frequency Provider Last Rate Last Admin   0.9 %  sodium  chloride infusion   Intravenous Continuous Cleatus Moccasin, MD 125 mL/hr at 08/06/23 0241 New Bag at 08/06/23 0241   famotidine (PEPCID) IVPB 20 mg premix  20 mg Intravenous Once Duncan, Paula, MD       metoCLOPramide (REGLAN) injection 10 mg  10 mg Intravenous Q6H Cleatus Moccasin, MD   10 mg at 08/06/23 9389   midodrine  (PROAMATINE ) tablet 5 mg  5 mg Oral TID AC Duncan, Paula, MD       ondansetron  (ZOFRAN ) injection 4 mg  4 mg Intravenous Q6H PRN Cleatus Moccasin, MD       phenol (CHLORASEPTIC) mouth spray 1 spray  1 spray Mouth/Throat PRN Dawson, Rolitta, CNM   1 spray at 08/05/23 2321   potassium chloride 10 mEq in 100 mL IVPB  10 mEq Intravenous Q1 Hr x 6 Cleatus Moccasin, MD 100 mL/hr at 08/06/23 0746 10 mEq at 08/06/23 0746   prenatal multivitamin tablet 1 tablet  1 tablet Oral Q1200 Cleatus Moccasin, MD       sodium chloride  flush (NS) 0.9 % injection 3-10 mL  3-10 mL Intravenous PRN Cleatus Moccasin, MD        Allergies as of 08/05/2023 - Review Complete 08/05/2023  Allergen Reaction Noted   Sulfa  antibiotics Shortness Of Breath 09/03/2020   Sulfamethoxazole -trimethoprim  Hives and Other (See Comments) 07/21/2020   Nitrofurantoin Diarrhea 03/31/2020   Baclofen  03/14/2017   Morphine and codeine Nausea Only 07/24/2015   Oxycodone Itching 07/24/2015   Tape  07/24/2015   Latex  07/24/2015   Tramadol Itching 07/24/2015    Family History  Problem Relation Age of Onset   Ovarian cysts Mother    Dysmenorrhea Mother    Diabetes Mother        type 2; having complications   Hypertension Mother    Bipolar disorder Brother    Schizophrenia Brother    Hypertension Other    Diabetes Other     Social History   Socioeconomic History   Marital status: Single    Spouse name: Not on file   Number of children: 1   Years of education: Not on file   Highest education level: Bachelor's degree (e.g., BA, AB, BS)  Occupational History   Occupation: disabled  Tobacco Use   Smoking status: Never    Smokeless tobacco: Never  Vaping Use   Vaping status: Never Used  Substance and Sexual Activity   Alcohol use: No    Alcohol/week: 0.0 standard drinks of alcohol   Drug use: No   Sexual activity: Not on file    Comment: sexually active at 33 years of age; has had one sexual partner. Used condoms  Other Topics Concern   Not on file  Social History Narrative   Not on file   Social Drivers of Health   Financial Resource Strain: Low Risk  (06/13/2023)   Overall Financial Resource Strain (CARDIA)    Difficulty of Paying Living Expenses: Not hard at all  Food Insecurity: No Food Insecurity (08/06/2023)   Hunger Vital Sign    Worried About Running Out of Food in the Last Year: Never true    Ran Out of Food in the Last Year: Never true  Transportation Needs: No Transportation Needs (08/06/2023)   PRAPARE - Administrator, Civil Service (Medical): No    Lack of Transportation (Non-Medical): No  Physical Activity: Inactive (06/13/2023)   Exercise Vital Sign    Days of Exercise per Week: 0 days    Minutes of Exercise per Session: 0 min  Stress: No Stress Concern Present (06/13/2023)   [employer] of Occupational Health - Occupational Stress Questionnaire    Feeling of Stress : Not at all  Social Connections: Moderately Isolated (06/13/2023)   Social Connection and Isolation Panel    Frequency of Communication with Friends and Family: More than three times a week    Frequency of Social Gatherings with Friends and Family: Once a week    Attends Religious Services: More than 4 times per year    Active Member of [employer] or Organizations: No    Attends Banker Meetings: Never    Marital Status: Never married  Intimate Partner Violence: Patient Unable To Answer (08/06/2023)   Humiliation, Afraid, Rape, and Kick questionnaire    Fear of Current or Ex-Partner: Patient unable to answer    Emotionally Abused: Patient unable to answer    Physically Abused: Patient unable to  answer    Sexually Abused: Patient unable to answer    Review of Systems: Pertinent positive and negative review of systems were  noted in the above HPI section.  All other review of systems was otherwise negative.   Physical Exam: Vital signs in last 24 hours: Temp:  [98.2 F (36.8 C)-99.4 F (37.4 C)] 98.6 F (37 C) (06/30 0822) Pulse Rate:  [67-95] 82 (06/30 0822) Resp:  [16-17] 17 (06/30 0822) BP: (63-124)/(29-70) 123/68 (06/30 0822) SpO2:  [97 %-100 %] 100 % (06/30 9177)   General:   Alert,  Well-developed, well-nourished, petite African-American female pleasant and cooperative in NAD, husband at bedside Head:  Normocephalic and atraumatic. Eyes:  Sclera clear, no icterus.   Conjunctiva pink. Ears:  Normal auditory acuity. Nose:  No deformity, discharge,  or lesions. Mouth:  No deformity or lesions.   Neck:  Supple; no masses or thyromegaly. Lungs:  Clear throughout to auscultation.   No wheezes, crackles, or rhonchi.  Heart:  Regular rate and rhythm; no murmurs, clicks, rubs,  or gallops. Abdomen:  Soft, gravid uterus consistent with 31 weeks, nontender, bowel sounds are present Rectal: Not done Msk:  Symmetrical without gross deformities. . Pulses:  Normal pulses noted. Extremities:  Without clubbing or edema. Neurologic:  Alert and  oriented x4; quadriplegia, she does have some use of her upper extremities Skin:  Intact without significant lesions or rashes.. Psych:  Alert and cooperative. Normal mood and affect.  Intake/Output from previous day: 06/29 0701 - 06/30 0700 In: 309.3 [I.V.:289.6; IV Piggyback:19.7] Out: 575 [Urine:125; Blood:450] Intake/Output this shift: Total I/O In: -  Out: 450 [Urine:450]  Lab Results: Recent Labs    08/05/23 2224 08/06/23 0446  WBC 15.7* 13.4*  HGB 9.8* 8.9*  HCT 30.1* 27.6*  PLT 284 267   BMET Recent Labs    08/05/23 2224  NA 135  K 3.1*  CL 102  CO2 17*  GLUCOSE 90  BUN 9  CREATININE 0.73  CALCIUM 9.1    LFT Recent Labs    08/05/23 2224  PROT 6.7  ALBUMIN 2.5*  AST 36  ALT 36  ALKPHOS 80  BILITOT 0.9   PT/INR No results for input(s): LABPROT, INR in the last 72 hours. Hepatitis Panel No results for input(s): HEPBSAG, HCVAB, HEPAIGM, HEPBIGM in the last 72 hours.   IMPRESSION:  #70 33 year old female, quadriplegic after spinal cord injury, 31 weeks and 5 days intrauterine pregnancy who presents with 3-day history of nausea and vomiting which became intractable with inability to keep down p.o.'s. Patient on chronic midodrine  for chronic hypotension related to her quadriplegia.  Hypotensive on arrival related to above, and volume depletion.  Patient feeling much better already today just with IV fluids antiemetics.  There was concern about possible bleeding as she had brown emesis which they brought a specimen of.  It is possible that she had some heme in this specimen but there is no evidence of active GI bleeding, specimen is not coffee-ground appearing. She may have some retch gastropathy or esophagitis from multiple episodes of vomiting.  Abdominal films negative and upper abdominal ultrasound negative other than chronic finding of a small hyperechoic lesion in the right lobe of the liver.  Suspect a infectious gastroenteritis, versus UTI  #2 hypokalemia secondary to above #3 neurogenic bladder #4.  Chronic anemia, iron deficiency, hemoglobin is at her baseline, she has been receiving IV iron infusions as an outpatient and is due for one tomorrow which was her last dose  Plan; start clear liquid diet, advance to full liquids then bland as tolerates Continue Zofran  4 mg IV every 6 hours as needed  Will leave her on Pepcid IV today 20 mg every 12 once she is able to consistently keep down p.o.'s switch to oral Zofran  and oral Pepcid Would go ahead with IV Venofer while she is here Trend hemoglobin but no plans at this time for endoscopic intervention. GI will  follow with you   Samarra Ridgely EsterwoodPA-C  08/06/2023, 8:43 AM

## 2023-08-06 NOTE — H&P (Addendum)
 History     CSN: [id number]  Arrival date and time: 08/05/23 2022   Event Date/Time   First Provider Initiated Contact with Patient 08/05/23 2106      Chief Complaint  Patient presents with   Nausea   Emesis   HPI Cassandra Lowe is a 33 y.o. year old G2P1 female at [redacted]w[redacted]d weeks gestation who presents to MAU via EMS reporting vomiting coffee Ground emesis 4 times 1 hour prior to arriving to MAU. Per EMS, her BP was 60/40 at home and 70/40. She is a quadriplegic patient who takes Midodrine  to increase her BP. Her high risk pregnancy is managed by [facility]. Her spouse is present and contributing to the history taking.   OB History     Gravida  2   Para  1   Term      Preterm      AB      Living  1      SAB      IAB      Ectopic      Multiple      Live Births  1           Past Medical History:  Diagnosis Date   Burst fracture of cervical vertebra (HCC)    History of chicken pox    Quadriplegia (HCC)     Past Surgical History:  Procedure Laterality Date   Bladder botox     C4-C6 cervical fusion with Decompression or C5 burst fracture  02/12/2014   MOUTH SURGERY     one tooth removed   Reversal of Tracheostomy  03/22/2014   TRACHEOSTOMY  02/26/2014   pallative tracheostomy for respiratory failure secondary to C-spine fracture and paraplegia    Family History  Problem Relation Age of Onset   Ovarian cysts Mother    Dysmenorrhea Mother    Diabetes Mother        type 2; having complications   Hypertension Mother    Bipolar disorder Brother    Schizophrenia Brother    Hypertension Other    Diabetes Other     Social History   Tobacco Use   Smoking status: Never   Smokeless tobacco: Never  Vaping Use   Vaping status: Never Used  Substance Use Topics   Alcohol use: No    Alcohol/week: 0.0 standard drinks of alcohol   Drug use: No    Allergies:  Allergies  Allergen Reactions   Sulfa  Antibiotics Shortness Of Breath     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     Medications Prior to Admission  Medication Sig Dispense Refill Last Dose/Taking   Ascorbic Acid (VITAMIN C) 100 MG tablet Take 100 mg by mouth daily. When remembers   Past Week   bisacodyl  (MAGIC BULLETS) 10 MG suppository Place 1 suppository (10 mg total) rectally daily as needed for mild constipation or moderate constipation.  12 suppository 4 Past Week   midodrine  (PROAMATINE ) 5 MG tablet TAKE 2 TABLETS BY MOUTH THREE TIMES DAILY AS NEEDED 540 tablet 1 08/05/2023 Noon   oxybutynin (DITROPAN) 5 MG tablet Take by mouth.   Past Week   albuterol  (PROVENTIL  HFA;VENTOLIN  HFA) 108 (90 Base) MCG/ACT inhaler Inhale 2 puffs into the lungs every 6 (six) hours as needed for wheezing or shortness of breath. 1 Inhaler 1    cyclobenzaprine (FLEXERIL) 10 MG tablet Take 10 mg by mouth 3 (three) times daily as needed.   More than a month   diclofenac (VOLTAREN) 75 MG EC tablet    More than a month   EPINEPHrine  0.3 mg/0.3 mL IJ SOAJ injection Inject 0.3 mg into the muscle as needed for anaphylaxis. 2 each 3    Fluocinolone Acetonide Scalp 0.01 % OIL Apply topically daily.      fosfomycin (MONUROL) 3 g PACK       Ginger, Zingiber officinalis, (GINGER PO) Take by mouth as needed.      HYDROcodone  bit-homatropine (HYCODAN) 5-1.5 MG/5ML syrup Take 5 mLs by mouth every 8 (eight) hours as needed for cough. (Patient not taking: Reported on  06/13/2023) 120 mL 0 More than a month   hyoscyamine  (ANASPAZ ) 0.125 MG TBDP disintergrating tablet Place 1 tablet (0.125 mg total) under the tongue every 4 (four) hours as needed. 10 tablet 0    ibuprofen (ADVIL) 600 MG tablet       ketoconazole  (NIZORAL ) 2 % shampoo Apply 1 Application topically 2 (two) times a week. 120 mL 1    Multiple Vitamin (MULTI-VITAMIN) tablet Take 1 tablet by mouth daily.      MYRBETRIQ 25 MG TB24 tablet Take by mouth.      norethindrone (MICRONOR) 0.35 MG tablet Take 1 tablet by mouth daily.      Omega-3 1000 MG CAPS Take by mouth.      ondansetron  (ZOFRAN ) 4 MG tablet Take 1 tablet (4 mg total) by mouth every 8 (eight) hours as needed for nausea or vomiting. 20 tablet 1    Probiotic Product (MAGE) CPDR Take 1 capsule by mouth daily.      promethazine  (PHENERGAN ) 12.5 MG tablet Take 1 tablet (12.5 mg total) by mouth every 6 (six) hours as needed for nausea or vomiting. 10 tablet 4 More than a month   saccharomyces boulardii (FLORASTOR) 250 MG capsule Take by mouth.      sodium chloride  0.9 % injection Inject into the vein.      sodium chloride  irrigation 0.9 % irrigation Irrigate with as directed.      traMADol (ULTRAM) 50 MG tablet Take by mouth.       Review of Systems  Constitutional:  Positive for appetite change.  HENT: Negative.    Eyes: Negative.   Respiratory: Negative.    Cardiovascular: Negative.   Gastrointestinal:  Positive for abdominal pain, nausea and vomiting (coffee grounds).  Endocrine: Negative.   Genitourinary: Negative.   Musculoskeletal: Negative.   Skin:        Dry, chapped lips  Hematological: Negative.   Psychiatric/Behavioral:  The patient is nervous/anxious.    Physical Exam  Patient Vitals for the past 24 hrs:  BP Temp Temp src Pulse Resp SpO2  08/05/23 2214 124/66 -- -- 67 -- 98 %  08/05/23 2212 (!) 63/29 -- -- 88 -- 99 %  08/05/23 2111 (!) 63/30 -- -- 91 -- --  08/05/23 2058 (!) 72/34 -- -- 87 -- --  08/05/23  2028  123/63 99.4 F (37.4 C) Oral 95 16 100 %      Physical Exam Vitals and nursing note reviewed. Exam conducted with a chaperone present.  Constitutional:      Appearance: Normal appearance. She is normal weight.   Cardiovascular:     Rate and Rhythm: Normal rate and regular rhythm.     Heart sounds: Normal heart sounds.  Pulmonary:     Effort: Pulmonary effort is normal.     Breath sounds: Normal breath sounds.  Abdominal:     General: Bowel sounds are decreased. There is distension.     Palpations: Abdomen is soft.     Tenderness: There is abdominal tenderness in the epigastric area.     Comments: mild  Genitourinary:    Comments: Cervical exam declined by patient - advised UC's more than likely d/t dehydration  Musculoskeletal:        General: Normal range of motion.   Skin:    General: Skin is warm and dry.   Neurological:     Mental Status: She is alert and oriented to person, place, and time.   Psychiatric:        Mood and Affect: Mood normal.        Behavior: Behavior normal.        Thought Content: Thought content normal.        Judgment: Judgment normal.    REACTIVE NST - FHR: 135 bpm / moderate variability / accels present / decels absent / TOCO: irregular every 3-10 mins  MAU Course  Procedures  MDM LR Bolus 1 liter Zofran  4 mg IVPB Compazine 10 mg IVP CBC CMP Amylase Lipase  Results for orders placed or performed during the hospital encounter of 08/05/23 (from the past 24 hours)  CBC     Status: Abnormal   Collection Time: 08/05/23 10:24 PM  Result Value Ref Range   WBC 15.7 (H) 4.0 - 10.5 K/uL   RBC 3.53 (L) 3.87 - 5.11 MIL/uL   Hemoglobin 9.8 (L) 12.0 - 15.0 g/dL   HCT 69.8 (L) 63.9 - 53.9 %   MCV 85.3 80.0 - 100.0 fL   MCH 27.8 26.0 - 34.0 pg   MCHC 32.6 30.0 - 36.0 g/dL   RDW 85.2 88.4 - 84.4 %   Platelets 284 150 - 400 K/uL   nRBC 0.0 0.0 - 0.2 %  Comprehensive metabolic panel with GFR     Status: Abnormal   Collection Time: 08/05/23  10:24 PM  Result Value Ref Range   Sodium 135 135 - 145 mmol/L   Potassium 3.1 (L) 3.5 - 5.1 mmol/L   Chloride 102 98 - 111 mmol/L   CO2 17 (L) 22 - 32 mmol/L   Glucose, Bld 90 70 - 99 mg/dL   BUN 9 6 - 20 mg/dL   Creatinine, Ser 9.26 0.44 - 1.00 mg/dL   Calcium 9.1 8.9 - 89.6 mg/dL   Total Protein 6.7 6.5 - 8.1 g/dL   Albumin 2.5 (L) 3.5 - 5.0 g/dL   AST 36 15 - 41 U/L   ALT 36 0 - 44 U/L   Alkaline Phosphatase 80 38 - 126 U/L   Total Bilirubin 0.9 0.0 - 1.2 mg/dL   GFR, Estimated >39 >39 mL/min   Anion gap 16 (H) 5 - 15  Amylase     Status: None   Collection Time: 08/05/23 10:24 PM  Result Value Ref Range   Amylase 76 28 - 100 U/L  Lipase,  blood     Status: None   Collection Time: 08/05/23 10:24 PM  Result Value Ref Range   Lipase 31 11 - 51 U/L    Consult with Dr. Avram [facility] on-call MD) @ 2140 - notified of patient's complaints and assessments, recommended tx plan: CBC, CMP, Amylase, Lipase, PPI, and continue anti-emetics Consult with Dr. Avram @ [phone] - updated on lab and U/S results, reassessment and patient still vomiting and unable to keep down sips of tea and medication - orders received: Abdominal X-Ray 2 view, NPO and admit for observation. One of his GI partners will come see the patient later this morning to evaluate if she will need to have a scope procedure done. 2145: Dr. Cleatus updated with recommendations from Dr. Avram - orders received to do RUQ Abdominal U/S  0140: Dr. Cleatus in to talk with patient and spouse about risks and benefits of abdominal x-ray and to discuss plan of care to admit to [facility] for observation and consultation from other disciplines.  Assessment and Plan  1. Coffee ground emesis (Primary) - Admit to OBSCU - Keep NPO until GI evaluates patient later today - Orders entered by Dr. Cleatus  2. Pain of upper abdomen  3. [redacted] weeks gestation of pregnancy    Ala Cart, [place] 08/05/2023, 9:06 PM

## 2023-08-07 DIAGNOSIS — R112 Nausea with vomiting, unspecified: Secondary | ICD-10-CM | POA: Diagnosis not present

## 2023-08-07 DIAGNOSIS — R932 Abnormal findings on diagnostic imaging of liver and biliary tract: Secondary | ICD-10-CM | POA: Diagnosis not present

## 2023-08-07 DIAGNOSIS — O219 Vomiting of pregnancy, unspecified: Secondary | ICD-10-CM

## 2023-08-07 DIAGNOSIS — Z3A31 31 weeks gestation of pregnancy: Secondary | ICD-10-CM | POA: Diagnosis not present

## 2023-08-07 DIAGNOSIS — O218 Other vomiting complicating pregnancy: Secondary | ICD-10-CM | POA: Diagnosis not present

## 2023-08-07 MED ORDER — SUCRALFATE 1 GM/10ML PO SUSP: 1.0000 g | Freq: Three times a day (TID) | ORAL | 0 refills | Status: AC

## 2023-08-07 MED ORDER — ONDANSETRON 4 MG PO TBDP: 4.0000 mg | ORAL_TABLET | Freq: Four times a day (QID) | ORAL | 0 refills | Status: AC | PRN

## 2023-08-07 MED ORDER — ASPIRIN-ACETAMINOPHEN-CAFFEINE 250-250-65 MG PO TABS
1.0000 | ORAL_TABLET | Freq: Once | ORAL | Status: AC
  Administered 2023-08-07: 1 via ORAL
  Filled 2023-08-07: qty 1

## 2023-08-07 MED ORDER — FAMOTIDINE 20 MG PO TABS: 20.0000 mg | ORAL_TABLET | Freq: Two times a day (BID) | ORAL | 3 refills | Status: AC

## 2023-08-07 NOTE — Progress Notes (Signed)
 Progress Note   Subjective  No further vomiting, tolerated a diet. Wants to go home. Feels well.    Objective   Vital signs in last 24 hours: Temp:  [97.4 F (36.3 C)-99.2 F (37.3 C)] 97.6 F (36.4 C) (07/01 0734) Pulse Rate:  [61-121] 97 (07/01 0734) Resp:  [17-18] 18 (07/01 0734) BP: (64-121)/(32-63) 75/32 (07/01 0734) SpO2:  [99 %-100 %] 99 % (07/01 0734)   General:    AA female in NAD Neurologic:  Alert and oriented,  grossly normal neurologically. Psych:  Cooperative. Normal mood and affect.  Intake/Output from previous day: 06/30 0701 - 07/01 0700 In: -  Out: 2225 [Urine:2225] Intake/Output this shift: Total I/O In: -  Out: 175 [Urine:175]  Lab Results: Recent Labs    08/05/23 2224 08/06/23 0446  WBC 15.7* 13.4*  HGB 9.8* 8.9*  HCT 30.1* 27.6*  PLT 284 267   BMET Recent Labs    08/05/23 2224  NA 135  K 3.1*  CL 102  CO2 17*  GLUCOSE 90  BUN 9  CREATININE 0.73  CALCIUM 9.1   LFT Recent Labs    08/05/23 2224  PROT 6.7  ALBUMIN 2.5*  AST 36  ALT 36  ALKPHOS 80  BILITOT 0.9   PT/INR No results for input(s): LABPROT, INR in the last 72 hours.  Studies/Results: DG Abd Portable 1V Result Date: 08/06/2023 CLINICAL DATA:  Coffee-ground emesis and abdominal pain. Patient is 33 years pregnant. Dr. Discussed risk and benefit with patient. Pregnancy waiver signed and scanned in. EXAM: PORTABLE ABDOMEN - 1 VIEW COMPARISON:  Ultrasound 08/06/2023 FINDINGS: Gaseous distention of the stomach and colon. No evidence of pathologic dilation. Gravid uterus. No acute osseous abnormality. IMPRESSION: Gaseous distention of the stomach and colon without evidence of pathologic dilation. Electronically Signed   By: Norman Gatlin M.D.   On: 08/06/2023 02:22   US  ABDOMEN LIMITED RUQ (LIVER/GB) Result Date: 08/06/2023 CLINICAL DATA:  Coffee-ground emesis and abdominal pain EXAM: ULTRASOUND ABDOMEN LIMITED RIGHT UPPER QUADRANT COMPARISON:  None Available.  FINDINGS: Gallbladder: Nondistended. No wall thickening or pericholecystic fluid. No sonographic Murphy sign noted by sonographer. Common bile duct: Diameter: 4 mm Liver: Circumscribed 2.2 x 2.8 x 2.8 cm hyperechoic lesion in the right hepatic lobe near the porta hepatis likely represents a benign hemangioma. Within normal limits in parenchymal echogenicity. Portal vein is patent on color Doppler imaging with normal direction of blood flow towards the liver. Other: None. IMPRESSION: 1. Nondistended gallbladder without evidence of cholecystitis. 2. Circumscribed 2.2 x 2.8 x 2.8 cm hyperechoic lesion in the right hepatic lobe near the porta hepatis likely represents a benign hemangioma. No follow-up is recommended in the absence of risk factors for [facility] or known malignancy. Electronically Signed   By: Norman Gatlin M.D.   On: 08/06/2023 01:10       Assessment / Plan:    33 y/o pregnant female here with the following:  Nausea / vomiting ? Coffee ground emesis - looked more so bilious to me (she brought in sample)  On pepcid 20mg  BID, liquid carafate, and Zofran . States she is feeling much better. Tolerated a diet. Would continue pepcid for a few weeks at least and then use PRN. Can use Zofran  / liquid Carafate PRN at home. No plans for endoscopy at this time in the setting of pregnancy. Her Hgb was at baseline, she has had anemia from pregnancy, and BUN was normal. I'm not certain she even had any bleeding,  if she did it was quite mild.   Okay to go home from GI perspective. We will sign off, call with questions.  Marcey Naval, MD [facility]

## 2023-08-07 NOTE — Progress Notes (Signed)
   08/07/23 1528  Departure Condition  Departure Condition Good  Mobility at [employer] (quadriplegic)  Patient/Caregiver Teaching Teach Back Method Used;Discharge instructions reviewed;Follow-up care reviewed;Admission discussed;Pain management discussed  Special teaching needs: Cognitive;Emotional  Departure Mode With significant other  Was procedural sedation performed on this patient during this visit? No   Pt alert and oriented, VS stable, no pain

## 2023-08-07 NOTE — Discharge Summary (Addendum)
 Physician Discharge Summary  Patient ID: Cassandra Lowe MRN: [id number] DOB/AGE: [birth year] 33 y.o.  Admit date: 08/05/2023 Discharge date: 08/07/2023  Admission Diagnoses: Nausea and vomiting in pregnancy, hematemasis  Discharge Diagnoses:  Principal Problem:   Nausea and vomiting of pregnancy, antepartum Active Problems:   Coffee ground emesis   Discharged Condition: good  Hospital Course:  33 y.o. year old G2P1 female at [redacted]w[redacted]d weeks gestation who presented to MAU on 6/29 via EMS reporting vomiting coffee Ground emesis 4 times 1 hour prior to arriving to MAU.  She was seen and evaluated by GI. Suspected esophagitis from vomiting. She was managed on pepcid, zofran  and liquid carafate, which improved her symptoms. On day of discharge, she was tolerating a regular diet without nausea/vomiting. She was discharged home on this regimen.  She has a history of chronic hypotension, managed with midodrine , due to her history of spinal cord injury causing autonomic dysfunction. She did have some low BPs while inpatient but this mostly was prior to her midodrine  administration and improved after her scheduled medications. She was discharged home on 7/1 and plans to follow up with her primary OB on 7/9.  Consults: GI  Significant Diagnostic Studies:  Abdominal ultrasound 6/30: IMPRESSION: 1. Nondistended gallbladder without evidence of cholecystitis. 2. Circumscribed 2.2 x 2.8 x 2.8 cm hyperechoic lesion in the right hepatic lobe near the porta hepatis likely represents a benign hemangioma. No follow-up is recommended in the absence of risk factors for [facility] or known malignancy.  AXR 6/30: IMPRESSION: Gaseous distention of the stomach and colon without evidence of pathologic dilation.  Lab Results  Component Value Date   WBC 13.4 (H) 08/06/2023   HGB 8.9 (L) 08/06/2023   HCT 27.6 (L) 08/06/2023   MCV 86.0 08/06/2023   PLT 267 08/06/2023   NST on day of discharge FHR 150s,  moderate variability, pos accels, one decel at 1345 followed by 1 hour of very reactive and reassuring tracing Toco: irregular contractions not felt by patient    Treatments: pepcid, carafate, zofran , IVF  Discharge Exam: Blood pressure (!) 96/44, pulse (!) 103, temperature 99.8 F (37.7 C), temperature source Oral, resp. rate 16, SpO2 96%. 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        Medication List     STOP taking these medications    bisacodyl  10 MG suppository Commonly known as: MAGIC BULLETS   cyclobenzaprine 10 MG tablet Commonly known as: FLEXERIL   diclofenac 75 MG EC tablet Commonly known as: VOLTAREN   EPINEPHrine  0.3 mg/0.3 mL Soaj injection Commonly known as: EPI-PEN   Fluocinolone Acetonide Scalp 0.01 % Oil   fosfomycin  3 g Pack Commonly known as: MONUROL   GINGER PO   HYDROcodone  bit-homatropine 5-1.5 MG/5ML syrup Commonly known as: HYCODAN   hyoscyamine  0.125 MG Tbdp disintergrating tablet Commonly known as: ANASPAZ    ibuprofen 600 MG tablet Commonly known as: ADVIL   Mage Cpdr   Multi-Vitamin tablet   Myrbetriq 25  MG Tb24 tablet Generic drug: mirabegron ER   Omega-3 1000 MG Caps   ondansetron  4 MG tablet Commonly known as: ZOFRAN    oxybutynin 5 MG tablet Commonly known as: DITROPAN   promethazine  12.5 MG tablet Commonly known as: PHENERGAN    saccharomyces boulardii 250 MG capsule Commonly known as: FLORASTOR   sodium chloride  0.9 % injection   sodium chloride  irrigation 0.9 % irrigation   traMADol 50 MG tablet Commonly known as: ULTRAM   vitamin C 100 MG tablet       TAKE these medications    albuterol  108 (90 Base) MCG/ACT inhaler Commonly known as: VENTOLIN  HFA Inhale 2 puffs into the lungs every 6 (six) hours as needed for wheezing or shortness of breath.   famotidine 20 MG tablet Commonly known as: PEPCID Take 1 tablet (20 mg total) by mouth 2 (two) times daily.   ketoconazole  2 % shampoo Commonly known as: NIZORAL  Apply 1 Application topically 2 (two) times a week.   midodrine  5 MG tablet Commonly known as: PROAMATINE  TAKE 2 TABLETS BY MOUTH THREE TIMES DAILY AS NEEDED   ondansetron  4 MG disintegrating tablet Commonly known as: ZOFRAN -ODT Take 1 tablet (4 mg total) by mouth every 6 (six) hours as needed for nausea.   sucralfate 1 GM/10ML suspension Commonly known as: CARAFATE Take 10 mLs (1 g total) by mouth every 8 (eight) hours.         Signed: Rollo ONEIDA Bring 08/07/2023, 3:12 PM

## 2023-08-08 DIAGNOSIS — G8250 Quadriplegia, unspecified: Secondary | ICD-10-CM | POA: Diagnosis not present

## 2023-08-08 DIAGNOSIS — R110 Nausea: Secondary | ICD-10-CM | POA: Diagnosis not present

## 2023-08-08 DIAGNOSIS — K209 Esophagitis, unspecified without bleeding: Secondary | ICD-10-CM | POA: Diagnosis not present

## 2023-08-08 DIAGNOSIS — R030 Elevated blood-pressure reading, without diagnosis of hypertension: Secondary | ICD-10-CM | POA: Diagnosis not present

## 2023-08-08 DIAGNOSIS — G904 Autonomic dysreflexia: Secondary | ICD-10-CM | POA: Diagnosis not present

## 2023-08-08 DIAGNOSIS — R000 Tachycardia, unspecified: Secondary | ICD-10-CM | POA: Diagnosis not present

## 2023-08-08 DIAGNOSIS — R569 Unspecified convulsions: Secondary | ICD-10-CM | POA: Diagnosis not present

## 2023-08-08 DIAGNOSIS — K279 Peptic ulcer, site unspecified, unspecified as acute or chronic, without hemorrhage or perforation: Secondary | ICD-10-CM | POA: Diagnosis not present

## 2023-08-08 DIAGNOSIS — Z993 Dependence on wheelchair: Secondary | ICD-10-CM | POA: Diagnosis not present

## 2023-08-08 DIAGNOSIS — R58 Hemorrhage, not elsewhere classified: Secondary | ICD-10-CM | POA: Diagnosis not present

## 2023-08-08 DIAGNOSIS — Z87440 Personal history of urinary (tract) infections: Secondary | ICD-10-CM | POA: Diagnosis not present

## 2023-08-08 DIAGNOSIS — D649 Anemia, unspecified: Secondary | ICD-10-CM | POA: Diagnosis not present

## 2023-08-08 DIAGNOSIS — N319 Neuromuscular dysfunction of bladder, unspecified: Secondary | ICD-10-CM | POA: Diagnosis not present

## 2023-08-08 DIAGNOSIS — R7881 Bacteremia: Secondary | ICD-10-CM | POA: Diagnosis not present

## 2023-08-08 DIAGNOSIS — K920 Hematemesis: Secondary | ICD-10-CM | POA: Diagnosis not present

## 2023-08-08 DIAGNOSIS — R7401 Elevation of levels of liver transaminase levels: Secondary | ICD-10-CM | POA: Diagnosis not present

## 2023-08-08 DIAGNOSIS — G8254 Quadriplegia, C5-C7 incomplete: Secondary | ICD-10-CM | POA: Diagnosis not present

## 2023-08-08 DIAGNOSIS — B9689 Other specified bacterial agents as the cause of diseases classified elsewhere: Secondary | ICD-10-CM | POA: Diagnosis not present

## 2023-08-08 DIAGNOSIS — N179 Acute kidney failure, unspecified: Secondary | ICD-10-CM | POA: Diagnosis not present

## 2023-08-08 DIAGNOSIS — N390 Urinary tract infection, site not specified: Secondary | ICD-10-CM | POA: Diagnosis not present

## 2023-08-20 DIAGNOSIS — R339 Retention of urine, unspecified: Secondary | ICD-10-CM | POA: Diagnosis not present

## 2023-08-23 DIAGNOSIS — N319 Neuromuscular dysfunction of bladder, unspecified: Secondary | ICD-10-CM | POA: Diagnosis not present

## 2023-08-23 DIAGNOSIS — N390 Urinary tract infection, site not specified: Secondary | ICD-10-CM | POA: Diagnosis not present

## 2023-08-24 ENCOUNTER — Telehealth: Payer: Self-pay | Admitting: Family Medicine

## 2023-08-24 NOTE — Telephone Encounter (Signed)
 Copied from CRM [id number]. Topic: General - Other >> Aug 23, 2023  2:41 PM DeAngela L wrote: Reason for CRM: Darice Lawrence is the case manager for the patient and her daughter she is calling to ask if the office received fax called [employer] form this was faxed on 08/07/23 and she refaxed the same form on 08/20/23 She is calling to ask if the office received the form because the patient services are almost up and she has to have this form completed and submitted soon this goes to [place]  [employer]   Call back num for any question [phone]  fax num [phone] (Dial a number 1 before area code on fax number)

## 2023-08-24 NOTE — Telephone Encounter (Signed)
Has been completed and sent to medical records to fax.

## 2023-08-27 DIAGNOSIS — R339 Retention of urine, unspecified: Secondary | ICD-10-CM | POA: Diagnosis not present

## 2023-08-27 DIAGNOSIS — N319 Neuromuscular dysfunction of bladder, unspecified: Secondary | ICD-10-CM | POA: Diagnosis not present

## 2023-09-20 DIAGNOSIS — R339 Retention of urine, unspecified: Secondary | ICD-10-CM | POA: Diagnosis not present

## 2023-09-26 ENCOUNTER — Ambulatory Visit: Admitting: Physician Assistant

## 2023-09-26 ENCOUNTER — Encounter: Payer: Self-pay | Admitting: Physician Assistant

## 2023-09-26 VITALS — BP 83/63 | HR 89 | Wt 95.0 lb

## 2023-09-26 DIAGNOSIS — H0011 Chalazion right upper eyelid: Secondary | ICD-10-CM | POA: Diagnosis not present

## 2023-09-26 MED ORDER — CIPROFLOXACIN HCL 0.3 % OP SOLN: 1.0000 [drp] | OPHTHALMIC | 0 refills | Status: AC

## 2023-09-26 NOTE — Progress Notes (Signed)
 New Patient Office Visit  Subjective    Patient ID: Cassandra Lowe, female    DOB: 02-10-1990  Age: 33 y.o. MRN: [id number]  CC:  Chief Complaint  Patient presents with   Chalazion   Discussed the use of AI scribe software for clinical note transcription with the patient, who gave verbal consent to proceed.  History of Present Illness   Cassandra Lowe is a 33 year old female with recurrent chalazions who presents with a painful eye nodule.  The eye nodule began last Monday, initially painful, and has increased in size despite thorough washing. Pain has decreased, but the nodule causes heaviness and blurred vision. She uses a hot compress and an ointment from [employer], which has not significantly improved the condition. There is no current drainage, though some occurs with a hot compress. Does not wear contacts, no injury or trauma to eye.  She has experienced three previous chalazions requiring procedural intervention.   She was examined in her vehicle due to patient using wheelchair and unable to operate wheelchair ramp today on the mobile unit  Outpatient Encounter Medications as of 09/26/2023  Medication Sig   albuterol  (PROVENTIL  HFA;VENTOLIN  HFA) 108 (90 Base) MCG/ACT inhaler Inhale 2 puffs into the lungs every 6 (six) hours as needed for wheezing or shortness of breath.   ciprofloxacin  (CILOXAN ) 0.3 % ophthalmic solution Place 1 drop into the right eye every 4 (four) hours while awake.   famotidine  (PEPCID ) 20 MG tablet Take 1 tablet (20 mg total) by mouth 2 (two) times daily.   ketoconazole  (NIZORAL ) 2 % shampoo Apply 1 Application topically 2 (two) times a week.   midodrine  (PROAMATINE ) 5 MG tablet TAKE 2 TABLETS BY MOUTH THREE TIMES DAILY AS NEEDED   ondansetron  (ZOFRAN -ODT) 4 MG disintegrating tablet Take 1 tablet (4 mg total) by mouth every 6 (six) hours as needed for nausea.   sucralfate  (CARAFATE ) 1 GM/10ML suspension Take 10 mLs (1 g total) by mouth every 8  (eight) hours.   No facility-administered encounter medications on file as of 09/26/2023.    Past Medical History:  Diagnosis Date   Burst fracture of cervical vertebra (HCC)    History of chicken pox    Quadriplegia (HCC)     Past Surgical History:  Procedure Laterality Date   Bladder botox     C4-C6 cervical fusion with Decompression or C5 burst fracture  02/12/2014   MOUTH SURGERY     one tooth removed   Reversal of Tracheostomy  03/22/2014   TRACHEOSTOMY  02/26/2014   pallative tracheostomy for respiratory failure secondary to C-spine fracture and paraplegia    Family History  Problem Relation Age of Onset   Ovarian cysts Mother    Dysmenorrhea Mother    Diabetes Mother        type 2; having complications   Hypertension Mother    Bipolar disorder Brother    Schizophrenia Brother    Hypertension Other    Diabetes Other     Social History   Socioeconomic History   Marital status: Single    Spouse name: Not on file   Number of children: 1   Years of education: Not on file   Highest education level: Bachelor's degree (e.g., BA, AB, BS)  Occupational History   Occupation: disabled  Tobacco Use   Smoking status: Never   Smokeless tobacco: Never  Vaping Use   Vaping status: Never Used  Substance and Sexual Activity   Alcohol use: No  Alcohol/week: 0.0 standard drinks of alcohol   Drug use: No   Sexual activity: Not on file    Comment: sexually active at 33 years of age; has had one sexual partner. Used condoms  Other Topics Concern   Not on file  Social History Narrative   Not on file   Social Drivers of Health   Financial Resource Strain: Low Risk  (06/13/2023)   Overall Financial Resource Strain (CARDIA)    Difficulty of Paying Living Expenses: Not hard at all  Food Insecurity: Low Risk  (08/09/2023)   Received from [facility]   Hunger Vital Sign    Within the past 12 months, you worried that your food would run out before you got money to buy  more: Never true    Within the past 12 months, the food you bought just didn't last and you didn't have money to get more. : Never true  Transportation Needs: No Transportation Needs (08/09/2023)   Received from [facility]    In the past 12 months, has lack of reliable transportation kept you from medical appointments, meetings, work or from getting things needed for daily living? : No  Physical Activity: Inactive (06/13/2023)   Exercise Vital Sign    Days of Exercise per Week: 0 days    Minutes of Exercise per Session: 0 min  Stress: No Stress Concern Present (06/13/2023)   [employer] of Occupational Health - Occupational Stress Questionnaire    Feeling of Stress : Not at all  Social Connections: Moderately Isolated (06/13/2023)   Social Connection and Isolation Panel    Frequency of Communication with Friends and Family: More than three times a week    Frequency of Social Gatherings with Friends and Family: Once a week    Attends Religious Services: More than 4 times per year    Active Member of [employer] or Organizations: No    Attends Banker Meetings: Never    Marital Status: Never married  Intimate Partner Violence: Patient Unable To Answer (08/06/2023)   Humiliation, Afraid, Rape, and Kick questionnaire    Fear of Current or Ex-Partner: Patient unable to answer    Emotionally Abused: Patient unable to answer    Physically Abused: Patient unable to answer    Sexually Abused: Patient unable to answer    Review of Systems  Constitutional:  Negative for chills and fever.  HENT: Negative.    Eyes:  Positive for blurred vision and redness. Negative for pain and discharge.  Respiratory:  Negative for shortness of breath.   Cardiovascular:  Negative for chest pain.  Gastrointestinal: Negative.   Genitourinary: Negative.   Skin: Negative.   Neurological: Negative.   Endo/Heme/Allergies: Negative.   Psychiatric/Behavioral: Negative.           Objective    BP (!) 83/63 (BP Location: Left Arm, Patient Position: Sitting, Cuff Size: Small)   Pulse 89   Wt 95 lb (43.1 kg)   BMI 15.81 kg/m   Physical Exam Vitals and nursing note reviewed.  Constitutional:      General: She is not in acute distress.    Appearance: Normal appearance.     Comments: Patient was examined sitting in her vehicle due to patient need for wheelchair and unable to operate wheelchair ramp today on mobile unit.  Patient agreeable to this type of exam  HENT:     Head: Normocephalic and atraumatic.     Right Ear: External ear normal.  Left Ear: External ear normal.     Nose: Nose normal.  Eyes:     General:        Right eye: No foreign body or discharge.        Left eye: No foreign body or discharge.     Conjunctiva/sclera:     Right eye: Right conjunctiva is injected.     Left eye: Left conjunctiva is not injected.     Comments: See photos  Cardiovascular:     Rate and Rhythm: Normal rate and regular rhythm.  Musculoskeletal:     Cervical back: Normal range of motion and neck supple.     Comments: Sitting in vehicle  Skin:    General: Skin is warm and dry.  Neurological:     General: No focal deficit present.     Mental Status: She is alert and oriented to person, place, and time.  Psychiatric:        Mood and Affect: Mood normal.        Behavior: Behavior normal.        Thought Content: Thought content normal.        Judgment: Judgment normal.        Assessment & Plan:   Problem List Items Addressed This Visit   None Visit Diagnoses       Chalazion right upper eyelid    -  Primary   Relevant Medications   ciprofloxacin  (CILOXAN ) 0.3 % ophthalmic solution   Other Relevant Orders   Ambulatory referral to Ophthalmology      1. Chalazion right upper eyelid (Primary) Patient education given on supportive care, red flags given for prompt reevaluation. - ciprofloxacin  (CILOXAN ) 0.3 % ophthalmic solution; Place 1 drop into the  right eye every 4 (four) hours while awake.  Dispense: 5 mL; Refill: 0 - Ambulatory referral to Ophthalmology   I have reviewed the patient's medical history (PMH, PSH, Social History, Family History, Medications, and allergies) , and have been updated if relevant. I spent 25 minutes reviewing chart and  face to face time with patient.    Return if symptoms worsen or fail to improve.   Kirk RAMAN Mayers, PA-C

## 2023-09-26 NOTE — Patient Instructions (Signed)
 VISIT SUMMARY:  You came in today because of a painful eye nodule that has been getting bigger despite your efforts to treat it at home. You have had similar issues in the past that required medical procedures. We discussed your current symptoms and your treatment options.  YOUR PLAN:  -CHALAZION OF EYELID: A chalazion is a small, usually painless lump or swelling that appears on your eyelid. It is caused by a blocked oil gland. Your current chalazion is causing heaviness and blurred vision. We have prescribed an antibiotic drop for you to use and referred you to an ophthalmologist for further evaluation and management.  On research of your chart, this is the same drop that was used previously   Chalazion  A chalazion is a swelling or lump on the eyelid. It can affect the upper eyelid or the lower eyelid. What are the causes? This condition may be caused by: Long-lasting (chronic) inflammation of the eyelid glands. A blocked oil gland in the eyelid. What are the signs or symptoms? Symptoms of this condition include: Swelling of the eyelid that: May spread to areas around the eye. May be painful. A hard lump on the eyelid. Blurry vision. The lump may make it hard to see out of the eye. How is this diagnosed? This condition is diagnosed with an examination of the eye. How is this treated? This condition is treated by applying a warm, moist cloth (warm compress) to the eyelid. If the condition does not improve, it may be treated with: Medicine that is applied to the eye. Oral medicines. Medicine that is injected into the chalazion. Surgery. Follow these instructions at home: Managing pain and swelling Apply a warm compress to the eyelid for 10-15 minutes, 4 to 6 times a day. This will help to open any blocked glands and to reduce redness and swelling. Take and apply over-the-counter and prescription medicines only as told by your health care provider. General instructions Do not  touch the chalazion. Do not try to remove the pus. Do not squeeze the chalazion or stick it with a pin or needle. Do not rub your eyes. Wash your hands often with soap and water for at least 20 seconds. Dry your hands with a clean towel. Keep your face, scalp, and eyebrows clean. Avoid wearing eye makeup. Keep all follow-up visits. This is important. Contact a health care provider if: Your eyelid is getting worse. You have a fever. The chalazion does not break open (rupture) or go away on its own and your eyelid has not improved for 4 weeks. Get help right away if: You have pain in your eye. Your vision worsens. The chalazion becomes painful or red. The chalazion gets bigger. Summary A chalazion is a swelling or lump on the upper or lower eyelid. It may be caused by chronic inflammation or a blocked oil gland. Apply a warm compress to the eyelid for 10-15 minutes, 4 to 6 times a day. Keep your face, scalp, and eyebrows clean. This information is not intended to replace advice given to you by your health care provider. Make sure you discuss any questions you have with your health care provider. Document Revised: 03/31/2020 Document Reviewed: 03/31/2020 Elsevier Patient Education  2024 [employer].

## 2023-10-15 DIAGNOSIS — N200 Calculus of kidney: Secondary | ICD-10-CM | POA: Diagnosis not present

## 2023-10-15 DIAGNOSIS — N319 Neuromuscular dysfunction of bladder, unspecified: Secondary | ICD-10-CM | POA: Diagnosis not present

## 2023-10-22 DIAGNOSIS — Z9359 Other cystostomy status: Secondary | ICD-10-CM | POA: Diagnosis not present

## 2023-10-22 DIAGNOSIS — N319 Neuromuscular dysfunction of bladder, unspecified: Secondary | ICD-10-CM | POA: Diagnosis not present

## 2023-10-22 DIAGNOSIS — R339 Retention of urine, unspecified: Secondary | ICD-10-CM | POA: Diagnosis not present

## 2023-10-22 DIAGNOSIS — N200 Calculus of kidney: Secondary | ICD-10-CM | POA: Diagnosis not present

## 2023-10-29 ENCOUNTER — Other Ambulatory Visit: Payer: Self-pay | Admitting: Family Medicine

## 2023-10-29 NOTE — Telephone Encounter (Unsigned)
 Copied from CRM [id number]. Topic: Clinical - Medication Refill >> Oct 29, 2023  2:45 PM Everette C wrote: Medication: midodrine  (PROAMATINE ) 5 MG tablet  Has the patient contacted their pharmacy? Yes (Agent: If no, request that the patient contact the pharmacy for the refill. If patient does not wish to contact the pharmacy document the reason why and proceed with request.) (Agent: If yes, when and what did the pharmacy advise?)  This is the patient's preferred pharmacy:  [facility] [address] [place] - [address] [place] [address] Phone: [phone] Fax: [phone] Hours: Not open 24 hours  Is this the correct pharmacy for this prescription? Yes If no, delete pharmacy and type the correct one.   Has the prescription been filled recently? Yes  Is the patient out of the medication? Yes  Has the patient been seen for an appointment in the last year OR does the patient have an upcoming appointment? Yes  Can we respond through MyChart? No  Agent: Please be advised that Rx refills may take up to 3 business days. We ask that you follow-up with your pharmacy.

## 2023-10-30 NOTE — Telephone Encounter (Signed)
 Requested medications are due for refill today.  yes  Requested medications are on the active medications list.  yes  Last refill. 11/06/2022 #540 1 rf  Future visit scheduled.   A wellness visit is scheduled.  Notes to clinic.  Refill not delegated.    Requested Prescriptions  Pending Prescriptions Disp Refills   midodrine  (PROAMATINE ) 5 MG tablet 540 tablet 1     Not Delegated - Cardiovascular: Midodrine  Failed - 10/30/2023  2:20 PM      Failed - This refill cannot be delegated      Failed - Last BP in normal range    BP Readings from Last 1 Encounters:  09/26/23 (!) 83/63         Passed - Cr in normal range and within 360 days    Creatinine  Date Value Ref Range Status  02/11/2014 0.83 0.60 - 1.30 mg/dL Final   Creatinine, Ser  Date Value Ref Range Status  08/05/2023 0.73 0.44 - 1.00 mg/dL Final         Passed - ALT in normal range and within 360 days    ALT  Date Value Ref Range Status  08/05/2023 36 0 - 44 U/L Final   SGPT (ALT)  Date Value Ref Range Status  02/11/2014 25 U/L Final    Comment:    14-63 NOTE: New Reference Range 08/26/13          Passed - AST in normal range and within 360 days    AST  Date Value Ref Range Status  08/05/2023 36 15 - 41 U/L Final   SGOT(AST)  Date Value Ref Range Status  02/11/2014 30 15 - 37 Unit/L Final         Passed - Valid encounter within last 12 months    Recent Outpatient Visits   None

## 2023-10-31 MED ORDER — MIDODRINE HCL 5 MG PO TABS: 10.0000 mg | ORAL_TABLET | Freq: Three times a day (TID) | ORAL | 0 refills | Status: AC | PRN

## 2023-11-14 DIAGNOSIS — R339 Retention of urine, unspecified: Secondary | ICD-10-CM | POA: Diagnosis not present

## 2023-11-26 DIAGNOSIS — N319 Neuromuscular dysfunction of bladder, unspecified: Secondary | ICD-10-CM | POA: Diagnosis not present

## 2023-11-26 DIAGNOSIS — R339 Retention of urine, unspecified: Secondary | ICD-10-CM | POA: Diagnosis not present

## 2023-11-26 DIAGNOSIS — N200 Calculus of kidney: Secondary | ICD-10-CM | POA: Diagnosis not present

## 2024-02-15 ENCOUNTER — Ambulatory Visit: Payer: Self-pay | Admitting: Family Medicine

## 2024-02-15 DIAGNOSIS — R051 Acute cough: Secondary | ICD-10-CM

## 2024-02-15 NOTE — Telephone Encounter (Signed)
 Copied from CRM #[id number]. Topic: Clinical - Medication Question >> Feb 15, 2024  3:10 PM Myrick T wrote: Reason for CRM: patient called to f/u on the medication she requested for her cough. She wanted to make sure he got the medicine before the office closed for the weekend >> Feb 15, 2024  3:14 PM Myrick T wrote: Patient also wants to make sure her script is sent to  [id number] [place], [place] - [address] Phone: [phone]  Fax: [phone]

## 2024-02-15 NOTE — Telephone Encounter (Signed)
 Pt states PCP normally rx cough medication for her when she has a cough. States has tried robitussin and delsym. Requesting call back or MyChart message. Transportation options limited d/t quadriplegia.  [id number] [place],  - [address] AT [facility] [address] [[id number]]   Copied from CRM [id number]. Topic: Clinical - Pink Word Triage >> Feb 15, 2024 11:43 AM Alfonso HERO wrote: Patient is quadriplegic had has an on going cough that's causing other issues. She is wanting to see if something can be sent over to the pharmacy for her. Past Medical History:  Diagnosis Date   Burst fracture of cervical vertebra (HCC)    History of chicken pox    Quadriplegia (HCC)    Reason for Disposition  Cough  Answer Assessment - Initial Assessment Questions Pt states PCP normally rx cough medication for her when she has a cough. States has tried robitussin and delsym. Requesting call back or MyChart message.   Pt denies any other symptoms. To seek in person care if new symptoms occur or if she develops SOB or wheezing.  1. ONSET: When did the cough begin?      3 days 3. SPUTUM: Describe the color of your sputum (e.g., none, dry cough; clear, white, yellow, green)     Non productive 5. DIFFICULTY BREATHING: Are you having difficulty breathing? If Yes, ask: How bad is it? (e.g., mild, moderate, severe)      Denies 6. FEVER: Do you have a fever? If Yes, ask: What is your temperature, how was it measured, and when did it start?     Denies  Protocols used: Cough - Acute Non-Productive-A-AH

## 2024-02-17 MED ORDER — HYDROCODONE BIT-HOMATROP MBR 5-1.5 MG/5ML PO SOLN: 5.0000 mL | Freq: Three times a day (TID) | ORAL | 0 refills | Status: AC | PRN

## 2024-02-17 NOTE — Addendum Note (Signed)
 Addended by: GASPER NANCYANN BRAVO on: 02/17/2024 09:57 AM   Modules accepted: Orders

## 2024-03-04 ENCOUNTER — Telehealth: Payer: Self-pay | Admitting: Family Medicine

## 2024-03-04 NOTE — Telephone Encounter (Signed)
 Copied from CRM #[id number]. Topic: Referral - Request for Referral >> Mar 04, 2024 11:57 AM Jasmin G wrote: Did the patient discuss referral with their provider in the last year? Yes (If No - schedule appointment) (If Yes - send message)  Appointment offered? No  Type of order/referral and detailed reason for visit: Spinal cord injury  Preference of office, provider, location: [facility], Phone #: [phone], Fax #: [phone]  If referral order, have you been seen by this specialty before? No (If Yes, this issue or another issue? When? Where?  Can we respond through MyChart? No

## 2024-06-24 ENCOUNTER — Ambulatory Visit
